# Patient Record
Sex: Female | Born: 1945 | Race: Black or African American | Hispanic: No | Marital: Married | Smoking: Never smoker
Health system: Southern US, Community
[De-identification: ages and names within clinical notes are randomized; demographics above are authoritative.]

## PROBLEM LIST (undated history)

## (undated) DIAGNOSIS — E785 Hyperlipidemia, unspecified: Secondary | ICD-10-CM

## (undated) DIAGNOSIS — I10 Essential (primary) hypertension: Secondary | ICD-10-CM

## (undated) HISTORY — PX: OTHER SURGICAL HISTORY: SHX169

---

## 2014-08-02 ENCOUNTER — Ambulatory Visit: Payer: Self-pay | Admitting: Family Medicine

## 2015-07-26 ENCOUNTER — Encounter: Payer: Self-pay | Admitting: Emergency Medicine

## 2015-07-26 ENCOUNTER — Emergency Department
Admission: EM | Admit: 2015-07-26 | Discharge: 2015-07-26 | Disposition: A | Discharge status: Home / Self Care | Payer: Medicare Other | Attending: Emergency Medicine | Admitting: Emergency Medicine

## 2015-07-26 DIAGNOSIS — K2100 Gastro-esophageal reflux disease with esophagitis, without bleeding: Secondary | ICD-10-CM

## 2015-07-26 DIAGNOSIS — R110 Nausea: Secondary | ICD-10-CM | POA: Diagnosis present

## 2015-07-26 LAB — LIPASE, BLOOD: Lipase: 29 U/L (ref 22–51)

## 2015-07-26 LAB — COMPREHENSIVE METABOLIC PANEL
ALBUMIN: 3.6 g/dL (ref 3.5–5.0)
ALK PHOS: 59 U/L (ref 38–126)
ALT: 26 U/L (ref 14–54)
ANION GAP: 6 (ref 5–15)
AST: 60 U/L — AB (ref 15–41)
BILIRUBIN TOTAL: 0.8 mg/dL (ref 0.3–1.2)
BUN: 8 mg/dL (ref 6–20)
CALCIUM: 8.5 mg/dL — AB (ref 8.9–10.3)
CO2: 27 mmol/L (ref 22–32)
Chloride: 106 mmol/L (ref 101–111)
Creatinine, Ser: 0.82 mg/dL (ref 0.44–1.00)
GFR calc Af Amer: >60 mL/min (ref >60)
GFR calc non Af Amer: >60 mL/min (ref >60)
GLUCOSE: 145 mg/dL — AB (ref 65–99)
POTASSIUM: 4.3 mmol/L (ref 3.5–5.1)
SODIUM: 139 mmol/L (ref 135–145)
TOTAL PROTEIN: 6.6 g/dL (ref 6.5–8.1)

## 2015-07-26 LAB — CBC
HEMATOCRIT: 32.4 % — AB (ref 35.0–47.0)
HEMOGLOBIN: 10.5 g/dL — AB (ref 12.0–16.0)
MCH: 31.6 pg (ref 26.0–34.0)
MCHC: 32.4 g/dL (ref 32.0–36.0)
MCV: 97.6 fL (ref 80.0–100.0)
Platelets: 136 10*3/uL — ABNORMAL LOW (ref 150–440)
RBC: 3.31 MIL/uL — ABNORMAL LOW (ref 3.80–5.20)
RDW: 14.3 % (ref 11.5–14.5)
WBC: 5 10*3/uL (ref 3.6–11.0)

## 2015-07-26 MED ORDER — ONDANSETRON 4 MG PO TBDP
4.0000 mg | ORAL_TABLET | Freq: Once | ORAL | Status: AC
  Administered 2015-07-26: 4 mg via ORAL
  Filled 2015-07-26: qty 1

## 2015-07-26 MED ORDER — ONDANSETRON HCL 4 MG PO TABS: 4.0000 mg | ORAL_TABLET | Freq: Every day | ORAL | Status: DC | PRN

## 2015-07-26 MED ORDER — SUCRALFATE 1 G PO TABS: 1.0000 g | ORAL_TABLET | Freq: Four times a day (QID) | ORAL | Status: DC

## 2015-07-26 MED ORDER — FAMOTIDINE 20 MG PO TABS
20.0000 mg | ORAL_TABLET | Freq: Once | ORAL | Status: AC
  Administered 2015-07-26: 20 mg via ORAL
  Filled 2015-07-26: qty 1

## 2015-07-26 MED ORDER — GI COCKTAIL ~~LOC~~
30.0000 mL | Freq: Once | ORAL | Status: AC
  Administered 2015-07-26: 30 mL via ORAL

## 2015-07-26 MED ORDER — GI COCKTAIL ~~LOC~~
ORAL | Status: AC
  Filled 2015-07-26: qty 30

## 2015-07-26 MED ORDER — RANITIDINE HCL 150 MG PO TABS: 150.0000 mg | ORAL_TABLET | Freq: Every day | ORAL | Status: DC

## 2015-07-26 NOTE — ED Notes (Signed)
Pt denies any abd pain and or chest pain in triage.

## 2015-07-26 NOTE — ED Provider Notes (Signed)
[facility] Emergency Department Provider Note     Time seen: ----------------------------------------- 2:47 PM on 07/26/2015 -----------------------------------------    I have reviewed the triage vital signs and the nursing notes.   HISTORY  Chief Complaint Nausea    HPI Sheri Butler is a 69 y.o. female who presents ER stating that for last 4-5 months food does not taste right and sometimes she gags on it. States she can drink and eat most things, but states they don't taste right she vomits them up. Patient states used to drink heavily, does drink anymore. She is concerned she may have affected her esophagus. She denies any fevers, chills, chest pain, shortness of breath, nausea or diarrhea.Patient denies any trouble swallowing.   History reviewed. No pertinent past medical history.  There are no active problems to display for this patient.   History reviewed. No pertinent past surgical history.  Allergies Review of patient's allergies indicates no known allergies.  Social History Social History  Substance Use Topics  . Smoking status: Never Smoker   . Smokeless tobacco: None  . Alcohol Use: Yes     Comment: everyday    Review of Systems Constitutional: Negative for fever. Eyes: Negative for visual changes. ENT: Negative for sore throat. Cardiovascular: Negative for chest pain. Respiratory: Negative for shortness of breath. Gastrointestinal: Negative for abdominal pain, positive for occasional vomiting due to the taste of food Genitourinary: Negative for dysuria. Musculoskeletal: Negative for back pain. Skin: Negative for rash. Neurological: Negative for headaches, focal weakness or numbness.  10-point ROS otherwise negative.  ____________________________________________   PHYSICAL EXAM:  VITAL SIGNS: ED Triage Vitals  Enc Vitals Group     BP 07/26/15 1136 136/78 mmHg     Pulse Rate 07/26/15 1136 85     Resp 07/26/15  1136 18     Temp 07/26/15 1136 97.9 F (36.6 C)     Temp Source 07/26/15 1136 Oral     SpO2 07/26/15 1136 100 %     Weight 07/26/15 1136 179 lb (81.194 kg)     Height 07/26/15 1136 5' (1.524 m)     Head Cir --      Peak Flow --      Pain Score --      Pain Loc --      Pain Edu? --      Excl. in [place]? --     Constitutional: Alert and oriented. Well appearing and in no distress. Eyes: Conjunctivae are normal. PERRL. Normal extraocular movements. ENT   Head: Normocephalic and atraumatic.   Nose: No congestion/rhinnorhea.   Mouth/Throat: Mucous membranes are moist.   Neck: No stridor. Cardiovascular: Normal rate, regular rhythm. Normal and symmetric distal pulses are present in all extremities. No murmurs, rubs, or gallops. Respiratory: Normal respiratory effort without tachypnea nor retractions. Breath sounds are clear and equal bilaterally. No wheezes/rales/rhonchi. Gastrointestinal: Soft and nontender. No distention. No abdominal bruits.  Musculoskeletal: Nontender with normal range of motion in all extremities. No joint effusions.  No lower extremity tenderness nor edema. Neurologic:  Normal speech and language. No gross focal neurologic deficits are appreciated. Speech is normal. No gait instability. Skin:  Skin is warm, dry and intact. No rash noted. Psychiatric: Mood and affect are normal. Speech and behavior are normal. Patient exhibits appropriate insight and judgment.  ____________________________________________  ED COURSE:  Pertinent labs & imaging results that were available during my care of the patient were reviewed by me and considered in my medical decision  making (see chart for details). Patient is in no acute distress, multiple possible etiologies. ____________________________________________    LABS (pertinent positives/negatives)  Labs Reviewed  COMPREHENSIVE METABOLIC PANEL - Abnormal; Notable for the following:    Glucose, Bld 145 (*)     Calcium 8.5 (*)    AST 60 (*)    All other components within normal limits  CBC - Abnormal; Notable for the following:    RBC 3.31 (*)    Hemoglobin 10.5 (*)    HCT 32.4 (*)    Platelets 136 (*)    All other components within normal limits  LIPASE, BLOOD    ____________________________________________  FINAL ASSESSMENT AND PLAN  Gastroesophageal reflux disease  Plan: Patient with labs and imaging as dictated above. Patient likely with GERD. We'll start on Zantac and Carafate. She'll be referred to gastroenterology for follow-up. If it is not GERD she will certainly need endoscopy, however not emergently due to this taking place for the last 6 months.   Earleen Newport, MD   Earleen Newport, MD 07/26/15 [phone]

## 2015-07-26 NOTE — ED Notes (Signed)
MD at bedside., Dr.Williams

## 2015-07-26 NOTE — ED Notes (Signed)
Pt complaining of not being able to taste food, for 3-4 months , gag on her food, tolerates fluids , speaking in completing sentences , denies difficulty swallowing

## 2015-07-26 NOTE — ED Notes (Signed)
Pt states has been nauseated and cannot eat for a couple of months. Has seen pcp for same and was given antiobiotics, but pcp is currently out of town at this time.

## 2015-07-26 NOTE — Discharge Instructions (Signed)
Gastroesophageal Reflux Disease, Adult Gastroesophageal reflux disease (GERD) happens when acid from your stomach flows up into the esophagus. When acid comes in contact with the esophagus, the acid causes soreness (inflammation) in the esophagus. Over time, GERD may create small holes (ulcers) in the lining of the esophagus. CAUSES   Increased body weight. This puts pressure on the stomach, making acid rise from the stomach into the esophagus.  Smoking. This increases acid production in the stomach.  Drinking alcohol. This causes decreased pressure in the lower esophageal sphincter (valve or ring of muscle between the esophagus and stomach), allowing acid from the stomach into the esophagus.  Late evening meals and a full stomach. This increases pressure and acid production in the stomach.  A malformed lower esophageal sphincter. Sometimes, no cause is found. SYMPTOMS   Burning pain in the lower part of the mid-chest behind the breastbone and in the mid-stomach area. This may occur twice a week or more often.  Trouble swallowing.  Sore throat.  Dry cough.  Asthma-like symptoms including chest tightness, shortness of breath, or wheezing. DIAGNOSIS  Your caregiver may be able to diagnose GERD based on your symptoms. In some cases, X-rays and other tests may be done to check for complications or to check the condition of your stomach and esophagus. TREATMENT  Your caregiver may recommend over-the-counter or prescription medicines to help decrease acid production. Ask your caregiver before starting or adding any new medicines.  HOME CARE INSTRUCTIONS   Change the factors that you can control. Ask your caregiver for guidance concerning weight loss, quitting smoking, and alcohol consumption.  Avoid foods and drinks that make your symptoms worse, such as:  Caffeine or alcoholic drinks.  Chocolate.  Peppermint or mint flavorings.  Garlic and onions.  Spicy foods.  Citrus fruits,  such as oranges, lemons, or limes.  Tomato-based foods such as sauce, chili, salsa, and pizza.  Fried and fatty foods.  Avoid lying down for the 3 hours prior to your bedtime or prior to taking a nap.  Eat small, frequent meals instead of large meals.  Wear loose-fitting clothing. Do not wear anything tight around your waist that causes pressure on your stomach.  Raise the head of your bed 6 to 8 inches with wood blocks to help you sleep. Extra pillows will not help.  Only take over-the-counter or prescription medicines for pain, discomfort, or fever as directed by your caregiver.  Do not take aspirin, ibuprofen, or other nonsteroidal anti-inflammatory drugs (NSAIDs). SEEK IMMEDIATE MEDICAL CARE IF:   You have pain in your arms, neck, jaw, teeth, or back.  Your pain increases or changes in intensity or duration.  You develop nausea, vomiting, or sweating (diaphoresis).  You develop shortness of breath, or you faint.  Your vomit is green, yellow, black, or looks like coffee grounds or blood.  Your stool is red, bloody, or black. These symptoms could be signs of other problems, such as heart disease, gastric bleeding, or esophageal bleeding. MAKE SURE YOU:   Understand these instructions.  Will watch your condition.  Will get help right away if you are not doing well or get worse. Document Released: 09/03/2005 Document Revised: 02/16/2012 Document Reviewed: 06/13/2011 Exit[employer] Patient Information 2015 [employer]. This information is not intended to replace advice given to you by your health care provider. Make sure you discuss any questions you have with your health care provider.  

## 2015-10-29 ENCOUNTER — Other Ambulatory Visit: Payer: Self-pay | Admitting: Family Medicine

## 2015-10-29 DIAGNOSIS — R432 Parageusia: Secondary | ICD-10-CM

## 2015-10-31 ENCOUNTER — Other Ambulatory Visit: Payer: Self-pay | Admitting: Family Medicine

## 2015-10-31 DIAGNOSIS — R634 Abnormal weight loss: Secondary | ICD-10-CM

## 2015-10-31 DIAGNOSIS — R7989 Other specified abnormal findings of blood chemistry: Secondary | ICD-10-CM

## 2015-10-31 DIAGNOSIS — R945 Abnormal results of liver function studies: Secondary | ICD-10-CM

## 2015-11-08 ENCOUNTER — Encounter: Payer: Self-pay | Admitting: Emergency Medicine

## 2015-11-08 ENCOUNTER — Ambulatory Visit
Admission: RE | Admit: 2015-11-08 | Discharge: 2015-11-08 | Disposition: A | Discharge status: Home / Self Care | Payer: Medicare Other | Source: Ambulatory Visit | Attending: Family Medicine | Admitting: Family Medicine

## 2015-11-08 ENCOUNTER — Inpatient Hospital Stay
Admission: EM | Admit: 2015-11-08 | Discharge: 2015-11-12 | DRG: 682 | Disposition: A | Discharge status: Home / Self Care | Payer: Medicare Other | Attending: Internal Medicine | Admitting: Internal Medicine

## 2015-11-08 DIAGNOSIS — R809 Proteinuria, unspecified: Secondary | ICD-10-CM | POA: Diagnosis present

## 2015-11-08 DIAGNOSIS — R630 Anorexia: Secondary | ICD-10-CM | POA: Diagnosis present

## 2015-11-08 DIAGNOSIS — Z6831 Body mass index (BMI) 31.0-31.9, adult: Secondary | ICD-10-CM | POA: Diagnosis not present

## 2015-11-08 DIAGNOSIS — R634 Abnormal weight loss: Secondary | ICD-10-CM | POA: Insufficient documentation

## 2015-11-08 DIAGNOSIS — R1110 Vomiting, unspecified: Secondary | ICD-10-CM | POA: Diagnosis present

## 2015-11-08 DIAGNOSIS — N179 Acute kidney failure, unspecified: Secondary | ICD-10-CM | POA: Diagnosis present

## 2015-11-08 DIAGNOSIS — D649 Anemia, unspecified: Secondary | ICD-10-CM | POA: Diagnosis present

## 2015-11-08 DIAGNOSIS — R945 Abnormal results of liver function studies: Secondary | ICD-10-CM

## 2015-11-08 DIAGNOSIS — R748 Abnormal levels of other serum enzymes: Secondary | ICD-10-CM

## 2015-11-08 DIAGNOSIS — E785 Hyperlipidemia, unspecified: Secondary | ICD-10-CM | POA: Diagnosis present

## 2015-11-08 DIAGNOSIS — K219 Gastro-esophageal reflux disease without esophagitis: Secondary | ICD-10-CM | POA: Diagnosis present

## 2015-11-08 DIAGNOSIS — E43 Unspecified severe protein-calorie malnutrition: Secondary | ICD-10-CM | POA: Diagnosis present

## 2015-11-08 DIAGNOSIS — Z79899 Other long term (current) drug therapy: Secondary | ICD-10-CM | POA: Diagnosis not present

## 2015-11-08 DIAGNOSIS — N170 Acute kidney failure with tubular necrosis: Principal | ICD-10-CM | POA: Diagnosis present

## 2015-11-08 DIAGNOSIS — K8020 Calculus of gallbladder without cholecystitis without obstruction: Secondary | ICD-10-CM

## 2015-11-08 DIAGNOSIS — I10 Essential (primary) hypertension: Secondary | ICD-10-CM | POA: Diagnosis present

## 2015-11-08 DIAGNOSIS — E860 Dehydration: Secondary | ICD-10-CM | POA: Diagnosis present

## 2015-11-08 DIAGNOSIS — Z7982 Long term (current) use of aspirin: Secondary | ICD-10-CM | POA: Diagnosis not present

## 2015-11-08 DIAGNOSIS — I959 Hypotension, unspecified: Secondary | ICD-10-CM | POA: Diagnosis present

## 2015-11-08 DIAGNOSIS — R7989 Other specified abnormal findings of blood chemistry: Secondary | ICD-10-CM

## 2015-11-08 HISTORY — DX: Essential (primary) hypertension: I10

## 2015-11-08 LAB — URINALYSIS COMPLETE WITH MICROSCOPIC (ARMC ONLY)
Bilirubin Urine: NEGATIVE
Glucose, UA: NEGATIVE mg/dL
KETONES UR: NEGATIVE mg/dL
NITRITE: NEGATIVE
PH: 6 (ref 5.0–8.0)
Protein, ur: 30 mg/dL — AB
SPECIFIC GRAVITY, URINE: 1.006 (ref 1.005–1.030)

## 2015-11-08 LAB — SODIUM, URINE, RANDOM: SODIUM UR: 11 mmol/L

## 2015-11-08 LAB — COMPREHENSIVE METABOLIC PANEL
ALBUMIN: 3.1 g/dL — AB (ref 3.5–5.0)
ALT: 50 U/L (ref 14–54)
AST: 79 U/L — AB (ref 15–41)
Alkaline Phosphatase: 91 U/L (ref 38–126)
Anion gap: 7 (ref 5–15)
BILIRUBIN TOTAL: 0.3 mg/dL (ref 0.3–1.2)
BUN: 21 mg/dL — ABNORMAL HIGH (ref 6–20)
CO2: 20 mmol/L — AB (ref 22–32)
Calcium: 8.7 mg/dL — ABNORMAL LOW (ref 8.9–10.3)
Chloride: 111 mmol/L (ref 101–111)
Creatinine, Ser: 3.81 mg/dL — ABNORMAL HIGH (ref 0.44–1.00)
GFR calc Af Amer: 13 mL/min — ABNORMAL LOW (ref >60)
GFR calc non Af Amer: 11 mL/min — ABNORMAL LOW (ref >60)
GLUCOSE: 109 mg/dL — AB (ref 65–99)
POTASSIUM: 4.1 mmol/L (ref 3.5–5.1)
SODIUM: 138 mmol/L (ref 135–145)
Total Protein: 6.5 g/dL (ref 6.5–8.1)

## 2015-11-08 LAB — CBC
HCT: 33.3 % — ABNORMAL LOW (ref 35.0–47.0)
HEMOGLOBIN: 10.7 g/dL — AB (ref 12.0–16.0)
MCH: 34.2 pg — AB (ref 26.0–34.0)
MCHC: 32.1 g/dL (ref 32.0–36.0)
MCV: 106.6 fL — ABNORMAL HIGH (ref 80.0–100.0)
PLATELETS: 140 10*3/uL — AB (ref 150–440)
RBC: 3.13 MIL/uL — AB (ref 3.80–5.20)
RDW: 15.8 % — ABNORMAL HIGH (ref 11.5–14.5)
WBC: 3.8 10*3/uL (ref 3.6–11.0)

## 2015-11-08 LAB — CREATININE, URINE, RANDOM: Creatinine, Urine: 84 mg/dL

## 2015-11-08 MED ORDER — ASPIRIN EC 81 MG PO TBEC
81.0000 mg | DELAYED_RELEASE_TABLET | Freq: Every day | ORAL | Status: DC
  Filled 2015-11-08: qty 1

## 2015-11-08 MED ORDER — ATENOLOL 25 MG PO TABS
25.0000 mg | ORAL_TABLET | Freq: Every day | ORAL | Status: DC
  Filled 2015-11-08: qty 1

## 2015-11-08 MED ORDER — ACETAMINOPHEN 325 MG PO TABS: 650.0000 mg | ORAL_TABLET | Freq: Four times a day (QID) | ORAL | Status: DC | PRN

## 2015-11-08 MED ORDER — ONDANSETRON HCL 4 MG PO TABS: 4.0000 mg | ORAL_TABLET | Freq: Four times a day (QID) | ORAL | Status: DC | PRN

## 2015-11-08 MED ORDER — SODIUM CHLORIDE 0.9 % IV SOLN
Freq: Once | INTRAVENOUS | Status: AC
  Administered 2015-11-08: 19:00:00 via INTRAVENOUS

## 2015-11-08 MED ORDER — HEPARIN SODIUM (PORCINE) 5000 UNIT/ML IJ SOLN
5000.0000 [IU] | Freq: Three times a day (TID) | INTRAMUSCULAR | Status: DC
  Administered 2015-11-09 – 2015-11-10 (×4): 5000 [IU] via SUBCUTANEOUS
  Filled 2015-11-08 (×4): qty 1

## 2015-11-08 MED ORDER — ONDANSETRON HCL 4 MG/2ML IJ SOLN
4.0000 mg | Freq: Four times a day (QID) | INTRAMUSCULAR | Status: DC | PRN
Start: 2015-11-08 — End: 2015-11-12

## 2015-11-08 MED ORDER — MORPHINE SULFATE (PF) 2 MG/ML IV SOLN: 2.0000 mg | INTRAVENOUS | Status: DC | PRN

## 2015-11-08 MED ORDER — SODIUM CHLORIDE 0.9 % IV SOLN
INTRAVENOUS | Status: DC
  Administered 2015-11-09 – 2015-11-11 (×7): via INTRAVENOUS

## 2015-11-08 MED ORDER — OXYCODONE HCL 5 MG PO TABS: 5.0000 mg | ORAL_TABLET | ORAL | Status: DC | PRN

## 2015-11-08 MED ORDER — POTASSIUM CHLORIDE CRYS ER 20 MEQ PO TBCR
20.0000 meq | EXTENDED_RELEASE_TABLET | Freq: Every day | ORAL | Status: DC
  Administered 2015-11-09 – 2015-11-12 (×4): 20 meq via ORAL
  Filled 2015-11-08 (×4): qty 1

## 2015-11-08 MED ORDER — ACETAMINOPHEN 650 MG RE SUPP: 650.0000 mg | Freq: Four times a day (QID) | RECTAL | Status: DC | PRN

## 2015-11-08 MED ORDER — PRAVASTATIN SODIUM 40 MG PO TABS
80.0000 mg | ORAL_TABLET | Freq: Every day | ORAL | Status: DC
  Administered 2015-11-10 – 2015-11-12 (×3): 80 mg via ORAL
  Filled 2015-11-08 (×4): qty 2

## 2015-11-08 MED ORDER — FLUTICASONE PROPIONATE 50 MCG/ACT NA SUSP
2.0000 | Freq: Every day | NASAL | Status: DC
  Filled 2015-11-08: qty 16

## 2015-11-08 NOTE — ED Notes (Signed)
Lab called [facility] hemolyzed, canx'd and redraw

## 2015-11-08 NOTE — ED Notes (Signed)
Pt daughter updated with pt room number assignment per pt

## 2015-11-08 NOTE — ED Notes (Signed)
Pt given phone for family member call, daughter, Larene Beach [phone]

## 2015-11-08 NOTE — ED Notes (Signed)
Patient presents to the ED after receiving a call from staff at [facility] telling patient that it was very important that she came in to the Emergency Department because she was very dehydrated according to her lab results.  Patient had routine labs drawn several days before.  Patient denies any nausea, vomiting, or diarrhea.  Patient states she has felt fine lately.

## 2015-11-08 NOTE — ED Notes (Signed)
Pt states that she was sent here from [facility] due to "dehydration". Denies pain. Pt alert and oriented X4, active, cooperative, pt in NAD. RR even and unlabored, color WNL.

## 2015-11-08 NOTE — ED Notes (Signed)
Lab notified pt blood specimen sent for testing. Lab verbally acknowledged.

## 2015-11-08 NOTE — ED Provider Notes (Signed)
[facility] Emergency Department Provider Note  ____________________________________________  Time seen: Approximately 8:17 PM  I have reviewed the triage vital signs and the nursing notes.   HISTORY  Chief Complaint Dehydration    HPI Sheri Butler is a 69 y.o. female who was called from [facility] and told to come to the ER because her labs look showed severe dehydration. Labs were drawn on Tuesday she reports. Patient says she feels well has not had any nausea vomiting black tarry stool or anything else. Denies any medical problems.  History reviewed. No pertinent past medical history.  There are no active problems to display for this patient.   History reviewed. No pertinent past surgical history.  Current Outpatient Rx  Name  Route  Sig  Dispense  Refill  . aspirin EC 81 MG tablet   Oral   Take 81 mg by mouth daily.         Marland Kitchen atenolol (TENORMIN) 25 MG tablet   Oral   Take 25 mg by mouth daily.          . fluticasone (FLONASE) 50 MCG/ACT nasal spray   Each Nare   Place 2 sprays into both nostrils daily.         Marland Kitchen ibuprofen (ADVIL,MOTRIN) 800 MG tablet   Oral   Take 1 tablet by mouth every 8 (eight) hours as needed.         . potassium chloride SA (K-DUR,KLOR-CON) 20 MEQ tablet   Oral   Take 1 tablet by mouth daily.         . pravastatin (PRAVACHOL) 80 MG tablet   Oral   Take 1 tablet by mouth daily.         . quinapril (ACCUPRIL) 20 MG tablet   Oral   Take 1 tablet by mouth daily.         . ondansetron (ZOFRAN) 4 MG tablet   Oral   Take 1 tablet (4 mg total) by mouth daily as needed for nausea or vomiting. Patient not taking: Reported on 11/08/2015   20 tablet   1   . ranitidine (ZANTAC) 150 MG tablet   Oral   Take 1 tablet (150 mg total) by mouth at bedtime. Patient not taking: Reported on 11/08/2015   30 tablet   1   . sucralfate (CARAFATE) 1 G tablet   Oral   Take 1 tablet (1 g total) by mouth 4  (four) times daily. Patient not taking: Reported on 11/08/2015   120 tablet   1     Allergies Review of patient's allergies indicates no known allergies.  No family history on file.  Social History Social History  Substance Use Topics  . Smoking status: Never Smoker   . Smokeless tobacco: None  . Alcohol Use: Yes     Comment: everyday    Review of Systems Constitutional: No fever/chills Eyes: No visual changes. ENT: No sore throat. Cardiovascular: Denies chest pain. Respiratory: Denies shortness of breath. Gastrointestinal: No abdominal pain.  No nausea, no vomiting.  No diarrhea.  No constipation. Genitourinary: Negative for dysuria. Musculoskeletal: Negative for back pain. Skin: Negative for rash. Neurological: Negative for headaches, focal weakness or numbness.  10-point ROS otherwise negative.  ____________________________________________   PHYSICAL EXAM:  VITAL SIGNS: ED Triage Vitals  Enc Vitals Group     BP 11/08/15 1751 115/73 mmHg     Pulse Rate 11/08/15 1751 68     Resp 11/08/15 1751 18  Temp 11/08/15 1751 97.3 F (36.3 C)     Temp Source 11/08/15 1751 Oral     SpO2 11/08/15 1751 100 %     Weight 11/08/15 1751 164 lb (74.39 kg)     Height 11/08/15 1751 5' (1.524 m)     Head Cir --      Peak Flow --      Pain Score 11/08/15 1757 0     Pain Loc --      Pain Edu? --      Excl. in [place]? --     Constitutional: Alert and oriented. Well appearing and in no acute distress. Eyes: Conjunctivae are normal. PERRL. EOMI. Head: Atraumatic. Nose: No congestion/rhinnorhea. Mouth/Throat: Mucous membranes are moist.  Oropharynx non-erythematous. Neck: No stridor. Cardiovascular: Normal rate, regular rhythm. Grossly normal heart sounds.  Good peripheral circulation. Respiratory: Normal respiratory effort.  No retractions. Lungs CTAB. Gastrointestinal: Soft and nontender. No distention. No abdominal bruits. No CVA tenderness. Musculoskeletal: No lower  extremity tenderness nor edema.  No joint effusions. Neurologic:  Normal speech and language. No gross focal neurologic deficits are appreciated. No gait instability. Skin:  Skin is warm, dry and intact. No rash noted. Psychiatric: Mood and affect are normal. Speech and behavior are normal.  ____________________________________________   LABS (all labs ordered are listed, but only abnormal results are displayed)  Labs Reviewed  CBC - Abnormal; Notable for the following:    RBC 3.13 (*)    Hemoglobin 10.7 (*)    HCT 33.3 (*)    MCV 106.6 (*)    MCH 34.2 (*)    RDW 15.8 (*)    Platelets 140 (*)    All other components within normal limits  COMPREHENSIVE METABOLIC PANEL - Abnormal; Notable for the following:    CO2 20 (*)    Glucose, Bld 109 (*)    BUN 21 (*)    Creatinine, Ser 3.81 (*)    Calcium 8.7 (*)    Albumin 3.1 (*)    AST 79 (*)    GFR calc non Af Amer 11 (*)    GFR calc Af Amer 13 (*)    All other components within normal limits  URINALYSIS COMPLETEWITH MICROSCOPIC ([facility] ONLY)   ____________________________________________  EKG  _______________________________________  RADIOLOGY   ____________________________________________   PROCEDURES   ____________________________________________   INITIAL IMPRESSION / ASSESSMENT AND PLAN / ED COURSE  Pertinent labs & imaging results that were available during my care of the patient were reviewed by me and considered in my medical decision making (see chart for details).   ____________________________________________   FINAL CLINICAL IMPRESSION(S) / ED DIAGNOSES  Final diagnoses:  Acute renal failure, unspecified acute renal failure type [facility])      Nena Polio, MD 11/08/15 2229

## 2015-11-08 NOTE — ED Notes (Signed)
Chrys Racer called for pt arrival

## 2015-11-08 NOTE — ED Notes (Signed)
Call from lab, blood hemolyzed, sending lab for draw

## 2015-11-08 NOTE — H&P (Signed)
[facility] [place] NAME: Sheri Butler    MR#:  [id number]  DATE OF BIRTH:  04-27-1946   DATE OF ADMISSION:  11/08/2015  PRIMARY CARE PHYSICIAN: Baltazar Apo, MD   REQUESTING/REFERRING PHYSICIAN: Malinda  CHIEF COMPLAINT:   Chief Complaint  Patient presents with  . Dehydration   abnormal labs  HISTORY OF PRESENT ILLNESS:  Sheri Butler  is a 69 y.o. female with a known history of essential hypertension who is presenting to the hospital on the insistence of her PCP saying she had markedly abnormal blood work. She had blood work performed because she has had ongoing approximately 7 months duration of food intolerance. She states that certain types of foods she can no longer handle which causes her to vomit therefore she is had poor oral intake for that entire duration. She states liquids do not bother her in factseafood and sugary foods also do not cause symptoms. Regardless of the workup of these above findings she had basic lab work performed which revealed markedly kidney injury. She denies any lightheadedness, fever, chills, recent infections, change in urination. She does take the occasional NSAID however rarely  PAST MEDICAL HISTORY:   Past Medical History  Diagnosis Date  . Hypertension     PAST SURGICAL HISTORY:  History reviewed. No pertinent past surgical history.  SOCIAL HISTORY:   Social History  Substance Use Topics  . Smoking status: Never Smoker   . Smokeless tobacco: Not on file  . Alcohol Use: Yes     Comment: everyday    FAMILY HISTORY:   Family History  Problem Relation Age of Onset  . Hypertension Other     DRUG ALLERGIES:  No Known Allergies  REVIEW OF SYSTEMS:  REVIEW OF SYSTEMS:  CONSTITUTIONAL: Denies fevers, chills, fatigue, weakness.  EYES: Denies blurred vision, double vision, or eye pain.  EARS, NOSE, THROAT: Denies tinnitus, ear pain, hearing loss.  RESPIRATORY: denies cough,  shortness of breath, wheezing  CARDIOVASCULAR: Denies chest pain, palpitations, edema.  GASTROINTESTINAL: Denies nausea,  positive vomiting,  denies diarrhea, abdominal pain.  GENITOURINARY: Denies dysuria, hematuria.  ENDOCRINE: Denies nocturia or thyroid problems. HEMATOLOGIC AND LYMPHATIC: Denies easy bruising or bleeding.  SKIN: Denies rash or lesions.  MUSCULOSKELETAL: Denies pain in neck, back, shoulder, knees, hips, or further arthritic symptoms.  NEUROLOGIC: Denies paralysis, paresthesias.  PSYCHIATRIC: Denies anxiety or depressive symptoms. Otherwise full review of systems performed by me is negative.   MEDICATIONS AT HOME:   Prior to Admission medications   Medication Sig Start Date End Date Taking? Authorizing Provider  aspirin EC 81 MG tablet Take 81 mg by mouth daily.   Yes Historical Provider, MD  atenolol (TENORMIN) 25 MG tablet Take 25 mg by mouth daily.  10/19/15  Yes Historical Provider, MD  fluticasone (FLONASE) 50 MCG/ACT nasal spray Place 2 sprays into both nostrils daily. 10/19/15  Yes Historical Provider, MD  ibuprofen (ADVIL,MOTRIN) 800 MG tablet Take 1 tablet by mouth every 8 (eight) hours as needed. 09/20/15  Yes Historical Provider, MD  potassium chloride SA (K-DUR,KLOR-CON) 20 MEQ tablet Take 1 tablet by mouth daily. 08/30/15  Yes Historical Provider, MD  pravastatin (PRAVACHOL) 80 MG tablet Take 1 tablet by mouth daily. 10/19/15  Yes Historical Provider, MD  quinapril (ACCUPRIL) 20 MG tablet Take 1 tablet by mouth daily. 10/19/15  Yes Historical Provider, MD  ondansetron (ZOFRAN) 4 MG tablet Take 1 tablet (4 mg total) by mouth daily as needed  for nausea or vomiting. Patient not taking: Reported on 11/08/2015 07/26/15   Earleen Newport, MD  ranitidine (ZANTAC) 150 MG tablet Take 1 tablet (150 mg total) by mouth at bedtime. Patient not taking: Reported on 11/08/2015 07/26/15 07/25/16  Earleen Newport, MD  sucralfate (CARAFATE) 1 G tablet Take 1 tablet (1 g  total) by mouth 4 (four) times daily. Patient not taking: Reported on 11/08/2015 07/26/15 07/25/16  Earleen Newport, MD      VITAL SIGNS:  Blood pressure 101/53, pulse 58, temperature 97.3 F (36.3 C), temperature source Oral, resp. rate 18, height 5' (1.524 m), weight 164 lb (74.39 kg), SpO2 100 %.  PHYSICAL EXAMINATION:  VITAL SIGNS: Filed Vitals:   11/08/15 2049 11/08/15 2226  BP: 97/58 101/53  Pulse: 57 58  Temp:    Resp: 18 18   GENERAL:69 y.o.female currently in no acute distress.  HEAD: Normocephalic, atraumatic.  EYES: Pupils equal, round, reactive to light. Extraocular muscles intact. No scleral icterus.  MOUTH: Moist mucosal membrane. Dentition intact. No abscess noted.  EAR, NOSE, THROAT: Clear without exudates. No external lesions.  NECK: Supple. No thyromegaly. No nodules. No JVD.  PULMONARY: Clear to ascultation, without wheeze rails or rhonci. No use of accessory muscles, Good respiratory effort. good air entry bilaterally CHEST: Nontender to palpation.  CARDIOVASCULAR: S1 and S2. Regular rate and rhythm. No murmurs, rubs, or gallops. No edema. Pedal pulses 2+ bilaterally.  GASTROINTESTINAL: Soft, nontender, nondistended. No masses. Positive bowel sounds. No hepatosplenomegaly.  MUSCULOSKELETAL: No swelling, clubbing, or edema. Range of motion full in all extremities.  NEUROLOGIC: Cranial nerves II through XII are intact. No gross focal neurological deficits. Sensation intact. Reflexes intact.  SKIN: No ulceration, lesions, rashes, or cyanosis. Skin warm and dry. Turgor intact.  PSYCHIATRIC: Mood, affect within normal limits. The patient is awake, alert and oriented x 3. Insight, judgment intact.    LABORATORY PANEL:   CBC  Recent Labs Lab 11/08/15 1808  WBC 3.8  HGB 10.7*  HCT 33.3*  PLT 140*   ------------------------------------------------------------------------------------------------------------------  Chemistries   Recent Labs Lab  11/08/15 2022  NA 138  K 4.1  CL 111  CO2 20*  GLUCOSE 109*  BUN 21*  CREATININE 3.81*  CALCIUM 8.7*  AST 79*  ALT 50  ALKPHOS 91  BILITOT 0.3   ------------------------------------------------------------------------------------------------------------------  Cardiac Enzymes No results for input(s): TROPONINI in the last 168 hours. ------------------------------------------------------------------------------------------------------------------  RADIOLOGY:  US Abdomen Complete  11/08/2015  CLINICAL DATA:  Abnormal weight loss and elevated liver enzymes EXAM: ULTRASOUND ABDOMEN COMPLETE COMPARISON:  None. FINDINGS: Gallbladder: Within the gallbladder, there are multiple echogenic foci which move and shadow consistent with gallstones. Largest individual gallstone measures 5 mm. There is mild gallbladder wall thickening at several sites within the gallbladder. The gallbladder wall does not appear edematous, and there is no pericholecystic fluid. No sonographic Murphy sign noted. Common bile duct: Diameter: 4 mm. There is no intrahepatic, common hepatic, or common bile duct dilatation. Liver: No focal lesion identified. Liver echogenicity is diffusely increased. IVC: No abnormality visualized. Pancreas: Visualized portion unremarkable. Much of the pancreas is obscured by gas. Spleen: Size and appearance within normal limits. Right Kidney: Length: 9.9 cm. Echogenicity within normal limits. No mass or hydronephrosis visualized. Left Kidney: Length: 9.6 cm. Echogenicity within normal limits. No mass or hydronephrosis visualized. Abdominal aorta: No aneurysm visualized. Other findings: No demonstrable ascites. IMPRESSION: Cholelithiasis. Areas of the gallbladder wall appear mildly thickened. There may be a degree of chronic cholecystitis.  Note that there is no pericholecystic fluid or focal tenderness over the gallbladder, however. Diffuse increased liver echogenicity is most likely indicative of  hepatic steatosis. While no focal liver lesions are identified, it must be cautioned that the sensitivity of ultrasound for focal liver lesions is diminished significantly given this appearance. Much of the pancreas is obscured by gas. The visualized portions of pancreas appear within normal limits. Study otherwise unremarkable. Electronically Signed   By: Lowella Grip III M.D.   On: 11/08/2015 09:35   Dg Esophagus  11/08/2015  CLINICAL DATA:  Loss of taste. EXAM: ESOPHOGRAM/BARIUM SWALLOW TECHNIQUE: Combined double contrast and single contrast examination performed using effervescent crystals, thick barium liquid, and thin barium liquid. FLUOROSCOPY TIME:  Radiation Exposure Index (as provided by the fluoroscopic device): 57.0 mGy COMPARISON:  No prior . FINDINGS: Prominent cricopharyngeus muscle. Cervical esophagus otherwise unremarkable. Mild narrowing of the distal esophagus. This esophageal small ulcer or diverticulum cannot be excluded. Endoscopic evaluation is suggested. No mass lesion. No reflux. Standardized barium tablet passed easily into the stomach. IMPRESSION: 1. Prominent cricopharyngeus muscle. 2. Mild distal esophageal narrowing. Small distal esophageal ulceration or diverticulum cannot be excluded. Endoscopic evaluation is suggested for further evaluation. No reflux. Standardized barium tablet passed easily into the stomach. Electronically Signed   By: Marcello Moores  Register   On: 11/08/2015 10:28    EKG:  No orders found for this or any previous visit.  IMPRESSION AND PLAN:   69 year old African-American female who is presenting a troponin I of abnormal blood work  1. Acute kidney injury: Unclear etiology however likely now ATN given the long-standing history of poor oral intake secondary to vomiting and food intolerance. IV fluid challenge, following urine output/renal function. We'll check urinary sodium and creatinine to calculate fena, check a renal ultrasound, avoid further  nephrotoxic agents including holding ACE inhibitor and further NSAIDs. 2. Essential hypertension: Continue with atenolol hold ACE inhibitor 3. Hyperlipidemia unspecified: Statin therapy 4. GERD without esophagitis: Zantac 5. Venous thromboembolism prophylactic: Heparin subcutaneous   All the records are reviewed and case discussed with ED provider. Management plans discussed with the patient, family and they are in agreement.  CODE STATUS: Full  TOTAL TIME TAKING CARE OF THIS PATIENT: 35  minutes.    Jesiel Garate,  Karenann Cai.D on 11/08/2015 at 11:14 PM  Between 7am to 6pm - Pager - [phone]  After 6pm: House Pager: - [phone]  [facility]  Office  [phone]  CC: Primary care physician; Baltazar Apo, MD

## 2015-11-08 NOTE — ED Notes (Signed)
[name], Lab called to add on

## 2015-11-09 LAB — CBC
HCT: 30.7 % — ABNORMAL LOW (ref 35.0–47.0)
HEMOGLOBIN: 10 g/dL — AB (ref 12.0–16.0)
MCH: 34.7 pg — ABNORMAL HIGH (ref 26.0–34.0)
MCHC: 32.5 g/dL (ref 32.0–36.0)
MCV: 106.9 fL — ABNORMAL HIGH (ref 80.0–100.0)
Platelets: 98 10*3/uL — ABNORMAL LOW (ref 150–440)
RBC: 2.87 MIL/uL — AB (ref 3.80–5.20)
RDW: 15.3 % — ABNORMAL HIGH (ref 11.5–14.5)
WBC: 3.1 10*3/uL — AB (ref 3.6–11.0)

## 2015-11-09 LAB — BASIC METABOLIC PANEL
ANION GAP: 4 — AB (ref 5–15)
BUN: 17 mg/dL (ref 6–20)
CALCIUM: 8.5 mg/dL — AB (ref 8.9–10.3)
CO2: 21 mmol/L — ABNORMAL LOW (ref 22–32)
Chloride: 116 mmol/L — ABNORMAL HIGH (ref 101–111)
Creatinine, Ser: 3.52 mg/dL — ABNORMAL HIGH (ref 0.44–1.00)
GFR, EST AFRICAN AMERICAN: 14 mL/min — AB (ref >60)
GFR, EST NON AFRICAN AMERICAN: 12 mL/min — AB (ref >60)
GLUCOSE: 92 mg/dL (ref 65–99)
Potassium: 4.4 mmol/L (ref 3.5–5.1)
SODIUM: 141 mmol/L (ref 135–145)

## 2015-11-09 MED ORDER — ENSURE ENLIVE PO LIQD
237.0000 mL | Freq: Two times a day (BID) | ORAL | Status: DC
  Administered 2015-11-09 – 2015-11-12 (×6): 237 mL via ORAL

## 2015-11-09 NOTE — Plan of Care (Signed)
Problem: Education: Goal: Knowledge of [facility] Education information/materials will improve Outcome: Progressing Patient information folder given to patient.  Patient educated on reason for admission and the care/treatment plan.    Problem: Safety: Goal: Ability to remain free from injury will improve Outcome: Progressing Patient up independently in room.  Patient is steady on feet and plantar and dorsiflexion are strong.  Patient with chronic hypotension.  Educated on sitting on side of bed for a few moments in case of dizziness before walking.  Bed alarm on for safety.  Patient compliant with calling for assistance.  Patient without injury this shift.   Problem: Fluid Volume: Goal: Ability to maintain a balanced intake and output will improve Outcome: Progressing Patient admitted for dehydration and AKI.  IVF .9 NS running at 100 mL/hr.

## 2015-11-09 NOTE — Evaluation (Signed)
Physical Therapy Evaluation Patient Details Name: Sheri Butler MRN: [id number] DOB: 1946-11-01 Today's Date: 11/09/2015   History of Present Illness  Sheri Butler is a 69 y.o. female with a known history of essential hypertension who is presenting to the hospital on the insistence of her PCP saying she had markedly abnormal blood work. She had blood work performed because she has had ongoing approximately 7 months duration of food intolerance. She states that certain types of foods she can no longer handle which causes her to vomit therefore she is had poor oral intake for that entire duration. She states liquids do not bother her in factseafood and sugary foods also do not cause symptoms. Regardless of the workup of these above findings she had basic lab work performed which revealed markedly kidney injury. She denies any lightheadedness, fever, chills, recent infections, change in urination. She does take the occasional NSAID however rarely. Denies falls in the last 12 months  Clinical Impression  Pt is independent with all mobility at this time. She denies dizziness and reports she is at her baseline level of functioning. High level balance deficits in single leg stance, pt refuses OP PT. No further PT needs at this time. Pt is safe to return home with family at discharge. Order will be completed. Please enter new order if status changes. Pt will benefit from skilled PT services to address deficits in strength, balance, and mobility in order to return to full function at home.    Follow Up Recommendations No PT follow up (Refuse OP PT for balance)    Equipment Recommendations  None recommended by PT    Recommendations for Other Services       Precautions / Restrictions Precautions Precautions: None Restrictions Weight Bearing Restrictions: No      Mobility  Bed Mobility Overal bed mobility: Independent             General bed mobility comments: Good speed and  sequencing  Transfers Overall transfer level: Independent Equipment used: None             General transfer comment: Good speed, sequencing, and stability without assistive device  Ambulation/Gait Ambulation/Gait assistance: Independent Ambulation Distance (Feet): 80 Feet Assistive device: None Gait Pattern/deviations: WFL(Within Functional Limits)     General Gait Details: Slightly decreased speed with wide BOS and decreased step length. Overall WFL for community ambulation  Stairs            Wheelchair Mobility    Modified Rankin (Stroke Patients Only)       Balance Overall balance assessment: Needs assistance Sitting-balance support: No upper extremity supported Sitting balance-Leahy Scale: Normal       Standing balance-Leahy Scale: Good Standing balance comment: Single leg stance LLE: 5 seconds, RLE: 2 seconds                             Pertinent Vitals/Pain Pain Assessment: No/denies pain    Home Living Family/patient expects to be discharged to:: Private residence Living Arrangements: Children Available Help at Discharge: Family Type of Home: Apartment         Home Equipment: None      Prior Function Level of Independence: Independent               Hand Dominance        Extremity/Trunk Assessment   Upper Extremity Assessment: Overall WFL for tasks assessed  Lower Extremity Assessment: Overall WFL for tasks assessed         Communication   Communication: No difficulties  Cognition Arousal/Alertness: Awake/alert Behavior During Therapy: WFL for tasks assessed/performed Overall Cognitive Status: Within Functional Limits for tasks assessed                      General Comments      Exercises        Assessment/Plan    PT Assessment Patent does not need any further PT services  PT Diagnosis     PT Problem List    PT Treatment Interventions     PT Goals (Current goals can be  found in the Care Plan section)      Frequency     Barriers to discharge        Co-evaluation               End of Session Equipment Utilized During Treatment: Gait belt Activity Tolerance: Patient tolerated treatment well Patient left: in bed;with call bell/phone within reach           Time: 1015-1023 PT Time Calculation (min) (ACUTE ONLY): 8 min   Charges:     PT Treatments $Therapeutic Exercise: 8-22 mins   PT G Codes:       Lyndel Safe Ayshia Gramlich PT, DPT   Waleska Buttery 11/09/2015, 10:29 AM

## 2015-11-09 NOTE — Progress Notes (Signed)
[facility] [place] NAME: Sheri Butler    MR#:  [id number]  DATE OF BIRTH:  08-10-1946  SUBJECTIVE:  CHIEF COMPLAINT:   Chief Complaint  Patient presents with  . Dehydration  feels ok. No complaints, feeling tired.  REVIEW OF SYSTEMS:  Review of Systems  Constitutional: Negative for fever, weight loss, malaise/fatigue and diaphoresis.  HENT: Negative for ear discharge, ear pain, hearing loss, nosebleeds, sore throat and tinnitus.   Eyes: Negative for blurred vision and pain.  Respiratory: Negative for cough, hemoptysis, shortness of breath and wheezing.   Cardiovascular: Negative for chest pain, palpitations, orthopnea and leg swelling.  Gastrointestinal: Negative for heartburn, nausea, vomiting, abdominal pain, diarrhea, constipation and blood in stool.  Genitourinary: Negative for dysuria, urgency and frequency.  Musculoskeletal: Negative for myalgias and back pain.  Skin: Negative for itching and rash.  Neurological: Positive for weakness. Negative for dizziness, tingling, tremors, focal weakness, seizures and headaches.  Psychiatric/Behavioral: Negative for depression. The patient is not nervous/anxious.     DRUG ALLERGIES:  No Known Allergies VITALS:  Blood pressure 98/72, pulse 60, temperature 97.6 F (36.4 C), temperature source Oral, resp. rate 16, height 5' (1.524 m), weight 73.392 kg (161 lb 12.8 oz), SpO2 100 %. PHYSICAL EXAMINATION:  Physical Exam  Constitutional: She is oriented to person, place, and time and well-developed, well-nourished, and in no distress.  HENT:  Head: Normocephalic and atraumatic.  Eyes: Conjunctivae and EOM are normal. Pupils are equal, round, and reactive to light.  Neck: Normal range of motion. Neck supple. No tracheal deviation present. No thyromegaly present.  Cardiovascular: Normal rate, regular rhythm and normal heart sounds.   Pulmonary/Chest: Effort normal and breath sounds  normal. No respiratory distress. She has no wheezes. She exhibits no tenderness.  Abdominal: Soft. Bowel sounds are normal. She exhibits no distension. There is no tenderness.  Musculoskeletal: Normal range of motion.  Neurological: She is alert and oriented to person, place, and time. No cranial nerve deficit.  Skin: Skin is warm and dry. No rash noted.  Psychiatric: Mood and affect normal.   LABORATORY PANEL:   CBC  Recent Labs Lab 11/09/15 0548  WBC 3.1*  HGB 10.0*  HCT 30.7*  PLT 98*   ------------------------------------------------------------------------------------------------------------------ Chemistries   Recent Labs Lab 11/08/15 2022 11/09/15 0548  NA 138 141  K 4.1 4.4  CL 111 116*  CO2 20* 21*  GLUCOSE 109* 92  BUN 21* 17  CREATININE 3.81* 3.52*  CALCIUM 8.7* 8.5*  AST 79*  --   ALT 50  --   ALKPHOS 91  --   BILITOT 0.3  --    RADIOLOGY:  Dg Esophagus  11/08/2015  CLINICAL DATA:  Loss of taste. EXAM: ESOPHOGRAM/BARIUM SWALLOW TECHNIQUE: Combined double contrast and single contrast examination performed using effervescent crystals, thick barium liquid, and thin barium liquid. FLUOROSCOPY TIME:  Radiation Exposure Index (as provided by the fluoroscopic device): 57.0 mGy COMPARISON:  No prior . FINDINGS: Prominent cricopharyngeus muscle. Cervical esophagus otherwise unremarkable. Mild narrowing of the distal esophagus. This esophageal small ulcer or diverticulum cannot be excluded. Endoscopic evaluation is suggested. No mass lesion. No reflux. Standardized barium tablet passed easily into the stomach. IMPRESSION: 1. Prominent cricopharyngeus muscle. 2. Mild distal esophageal narrowing. Small distal esophageal ulceration or diverticulum cannot be excluded. Endoscopic evaluation is suggested for further evaluation. No reflux. Standardized barium tablet passed easily into the stomach. Electronically Signed   By: Marcello Moores  Register   On:  11/08/2015 10:28   ASSESSMENT  AND PLAN:  69 year old African-American female who is presenting for abnormal kidney function  1. Acute kidney injury: Unclear etiology however likely now ATN given the long-standing history of hypotension with poor oral intake secondary to vomiting and food intolerance. continue IV fluid, monitor urine output/renal function. We'll check urinary sodium and creatinine to calculate fena, abd ultrasound showed normal kidneys, avoid further nephrotoxic agents including holding ACE inhibitor and further NSAIDs. Creat 3.8 -> 3.5  2. Hypotension with h/o Essential hypertension: hold all BP meds. BP in 90s - asymptomatic  3. Hyperlipidemia unspecified: Statin therapy  4. GERD without esophagitis: Zantac  5. Venous thromboembolism prophylactic: Heparin subcutaneous     All the records are reviewed and case discussed with Care Management/Social Worker. Management plans discussed with the patient, family and they are in agreement.  CODE STATUS: FULL CODE  TOTAL TIME TAKING CARE OF THIS PATIENT: 35 minutes.   More than 50% of the time was spent in counseling/coordination of care: YES  POSSIBLE D/C IN 1-2 DAYS, DEPENDING ON CLINICAL CONDITION.   [facility], Sheri Butler M.D on 11/09/2015 at 9:43 AM  Between 7am to 6pm - Pager - [phone]  After 6pm go to [email or web address] - password [email or web address] [facility]  Office  [phone]  CC: Primary care physician; Baltazar Apo, MD  Note: This dictation was prepared with Dragon dictation along with smaller phrase technology. Any transcriptional errors that result from this process are unintentional.

## 2015-11-09 NOTE — Plan of Care (Signed)
Problem: Education: Goal: Knowledge of [facility] Education information/materials will improve Outcome: Progressing Has received General Education Handout. Oriented to Oncology unit. Instructed and Demonstrated how to use phone to call RN and CNA for Assistance. Verbalized and Demonstrated Understanding.  Problem: Safety: Goal: Ability to remain free from injury will improve Outcome: Progressing High fall risk with bed alarm activated. Instructed and Demonstrated how to use phone to call RN and CNA for Assistance. Verbalized and Demonstrated Understanding. Calls or waits for assistance. Remained free of injury of fall this shift.  Problem: Fluid Volume: Goal: Ability to maintain a balanced intake and output will improve Outcome: Progressing Encouraged Oral Intake.  NS infusing at 153ml/hr. Nephrology consulted.

## 2015-11-09 NOTE — Progress Notes (Signed)
Initial Nutrition Assessment  DOCUMENTATION CODES:   Severe malnutrition in context of chronic illness  INTERVENTION:   Meals and Snacks: Cater to patient preferences Medical Food Supplement Therapy: will recommend Ensure Enlive po BID, each supplement provides 350 kcal and 20 grams of protein   NUTRITION DIAGNOSIS:   Malnutrition related to chronic illness as evidenced by energy intake < or equal to 75% for > or equal to 1 month, severe depletion of muscle mass, percent weight loss, moderate depletions of muscle mass.  GOAL:   Patient will meet greater than or equal to 90% of their needs  MONITOR:    (Energy Intake, Anthropometrics, Electrolyte and Renal Profile, Digestive system)  REASON FOR ASSESSMENT:   Diagnosis    ASSESSMENT:   Pt admitted with acute kidney injury and dehydration. Pt reports not tolerating certain foods with n/v for the past 7 months PTA.  Past Medical History  Diagnosis Date  . Hypertension     Diet Order:  Diet Heart Room service appropriate?: Yes; Fluid consistency:: Thin    Current Nutrition: Pt reports eating all of yogurt this am with coffee for breakfast.   Food/Nutrition-Related History: Pt reports having a poor appetite for the past 7-8 months progressively. Pt reports eating usually cereal for breakfast, bites for lunch and dinner is usually french fries with hot sauce, sometimes vegetables. Pt reports tolerating sweets mostly but not meat (chicken, beef, pork or seafood) and not tolerating mashed potatoes well but was able to eat some vegetables. Pt reports not trying soups but ordered some for dinner tonight.  Pt reports drinking [employer] protein shakes at times, likes strawberry flavor. Pt reports tolerating liquids better than solids.   RD notes pt had visit to ED 07/26/2015 with dehydration and same complaint of not tolerating foods with n/v for 4 months prior to ED visit per MD note in [facility].   Scheduled Medications:  . feeding  supplement (ENSURE ENLIVE)  237 mL Oral BID WC  . fluticasone  2 spray Each Nare Daily  . heparin  5,000 Units Subcutaneous 3 times per day  . potassium chloride SA  20 mEq Oral Daily  . pravastatin  80 mg Oral Daily    Continuous Medications:  . sodium chloride 100 mL/hr at 11/09/15 1055     Electrolyte/Renal Profile and Glucose Profile:   Recent Labs Lab 11/08/15 2022 11/09/15 0548  NA 138 141  K 4.1 4.4  CL 111 116*  CO2 20* 21*  BUN 21* 17  CREATININE 3.81* 3.52*  CALCIUM 8.7* 8.5*  GLUCOSE 109* 92   Protein Profile:   Recent Labs Lab 11/08/15 2022  ALBUMIN 3.1*    Gastrointestinal Profile: Last BM:  11/08/2015   Nutrition-Focused Physical Exam Findings: Nutrition-Focused physical exam completed. Findings are no fat depletion, mild-severe muscle depletion, and no edema.     Weight Change: Pt reports weight of over 200lbs one year ago (approximately 20% weight loss in one year). Per [facility] weight of 179lbs on admission to ED on 07/26/2015 (10% weight loss in 4 months)   Skin:  Reviewed, no issues   Height:   Ht Readings from Last 1 Encounters:  11/09/15 5' (1.524 m)    Weight:   Wt Readings from Last 1 Encounters:  11/09/15 161 lb 12.8 oz (73.392 kg)   Wt Readings from Last 10 Encounters:  11/09/15 161 lb 12.8 oz (73.392 kg)  07/26/15 179 lb (81.194 kg)    Ideal Body Weight:   45.5kg  BMI:  Body mass index is 31.6 kg/(m^2).  Estimated Nutritional Needs:   Kcal:  BEE: 896kcals, TEE: (IF 1.1-1.3)(AF 1.3) 1282-1515kcals, using IBW of 45.5kg  Protein:  45-54g protein (1.0-1.2g/kg) using IBW of 45.5kg  Fluid:  1365-1533mL of fluid (30-41mL/kg) using IBW of 45.5kg  EDUCATION NEEDS:   No education needs identified at this time   HIGH Care Level  Dwyane Luo, RD, LDN Pager [phone]

## 2015-11-10 DIAGNOSIS — E43 Unspecified severe protein-calorie malnutrition: Secondary | ICD-10-CM | POA: Insufficient documentation

## 2015-11-10 LAB — CBC
HEMATOCRIT: 26.1 % — AB (ref 35.0–47.0)
HEMOGLOBIN: 8.4 g/dL — AB (ref 12.0–16.0)
MCH: 34.1 pg — ABNORMAL HIGH (ref 26.0–34.0)
MCHC: 32.3 g/dL (ref 32.0–36.0)
MCV: 105.7 fL — AB (ref 80.0–100.0)
Platelets: 66 10*3/uL — ABNORMAL LOW (ref 150–440)
RBC: 2.47 MIL/uL — ABNORMAL LOW (ref 3.80–5.20)
RDW: 15.6 % — AB (ref 11.5–14.5)
WBC: 3.7 10*3/uL (ref 3.6–11.0)

## 2015-11-10 LAB — IRON AND TIBC
Iron: 118 ug/dL (ref 28–170)
SATURATION RATIOS: 74 % — AB (ref 10.4–31.8)
TIBC: 159 ug/dL — AB (ref 250–450)
UIBC: 41 ug/dL

## 2015-11-10 LAB — FERRITIN: Ferritin: 784 ng/mL — ABNORMAL HIGH (ref 11–307)

## 2015-11-10 LAB — EOSINOPHIL, URINE: Eosinophil, Urine: NONE SEEN %

## 2015-11-10 LAB — PHOSPHORUS: PHOSPHORUS: 2.2 mg/dL — AB (ref 2.5–4.6)

## 2015-11-10 NOTE — Consult Note (Signed)
[facility] CONSULT NOTE    Date: 11/10/2015                  Patient Name:  Sheri Butler  MRN: [id number]  DOB: 14-Feb-1946  Age / Sex: 69 y.o., female         PCP: Baltazar Apo, MD                 Service Requesting Consult: Dr. Max Sane                 Reason for Consult: Acute renal failure.            History of Present Illness: Patient is a 69 y.o. female with a PMHx of hypertension, hyperlipidemia who was admitted to [facility] on 11/08/2015 for evaluation of acute renal failure. The patient reports that over the past 7 months he's been having decreased appetite and poor by mouth intake. She recently saw her primary care physician and labs were performed. Upon presentation here creatinine was quite elevated at 3.81. Her baseline creatinine is 0.82 on 07/26/2015. Urinalysis was performed which showed a urine sodium of 11. In addition no urine eosinophils were noted. There is only mild proteinuria in the urine with a protein of 31 g/dL. No significant hematuria noted. There was mild pyuria however. Patient does have history of taking NSAIDs intermittently.   Medications: Outpatient medications: Prescriptions prior to admission  Medication Sig Dispense Refill Last Dose  . aspirin EC 81 MG tablet Take 81 mg by mouth daily.   11/08/2015 at Unknown time  . atenolol (TENORMIN) 25 MG tablet Take 25 mg by mouth daily.    11/08/2015 at 0600  . fluticasone (FLONASE) 50 MCG/ACT nasal spray Place 2 sprays into both nostrils daily.   11/08/2015 at Unknown time  . ibuprofen (ADVIL,MOTRIN) 800 MG tablet Take 1 tablet by mouth every 8 (eight) hours as needed.   PRN at PRN  . potassium chloride SA (K-DUR,KLOR-CON) 20 MEQ tablet Take 1 tablet by mouth daily.   11/08/2015 at Unknown time  . pravastatin (PRAVACHOL) 80 MG tablet Take 1 tablet by mouth daily.   11/08/2015 at Unknown time  . quinapril (ACCUPRIL) 20 MG tablet Take 1 tablet by mouth daily.   unknown at unknown  .  ondansetron (ZOFRAN) 4 MG tablet Take 1 tablet (4 mg total) by mouth daily as needed for nausea or vomiting. (Patient not taking: Reported on 11/08/2015) 20 tablet 1 Not Taking at Unknown time  . ranitidine (ZANTAC) 150 MG tablet Take 1 tablet (150 mg total) by mouth at bedtime. (Patient not taking: Reported on 11/08/2015) 30 tablet 1 Not Taking at Unknown time  . sucralfate (CARAFATE) 1 G tablet Take 1 tablet (1 g total) by mouth 4 (four) times daily. (Patient not taking: Reported on 11/08/2015) 120 tablet 1 Not Taking at Unknown time    Current medications: Current Facility-Administered Medications  Medication Dose Route Frequency Provider Last Rate Last Dose  . 0.9 %  sodium chloride infusion   Intravenous Continuous Lytle Butte, MD 100 mL/hr at 11/10/15 0615    . acetaminophen (TYLENOL) tablet 650 mg  650 mg Oral Q6H PRN Lytle Butte, MD       Or  . acetaminophen (TYLENOL) suppository 650 mg  650 mg Rectal Q6H PRN Lytle Butte, MD      . feeding supplement (ENSURE ENLIVE) (ENSURE ENLIVE) liquid 237 mL  237 mL Oral BID WC Max Sane, MD  237 mL at 11/10/15 0800  . fluticasone (FLONASE) 50 MCG/ACT nasal spray 2 spray  2 spray Each Nare Daily Lytle Butte, MD   2 spray at 11/09/15 0920  . morphine 2 MG/ML injection 2 mg  2 mg Intravenous Q4H PRN Lytle Butte, MD      . ondansetron [facility]) tablet 4 mg  4 mg Oral Q6H PRN Lytle Butte, MD       Or  . ondansetron [facility]) injection 4 mg  4 mg Intravenous Q6H PRN Lytle Butte, MD      . oxyCODONE (Oxy IR/ROXICODONE) immediate release tablet 5 mg  5 mg Oral Q4H PRN Lytle Butte, MD      . potassium chloride SA (K-DUR,KLOR-CON) CR tablet 20 mEq  20 mEq Oral Daily Lytle Butte, MD   20 mEq at 11/10/15 0857  . pravastatin (PRAVACHOL) tablet 80 mg  80 mg Oral Daily Lytle Butte, MD   80 mg at 11/10/15 [id number]      Allergies: No Known Allergies    Past Medical History: Past Medical History  Diagnosis Date  . Hypertension      Past  Surgical History: History reviewed. No pertinent past surgical history.   Family History: Family History  Problem Relation Age of Onset  . Hypertension Other      Social History: Social History   Social History  . Marital Status: Married    Spouse Name: N/A  . Number of Children: N/A  . Years of Education: N/A   Occupational History  . Not on file.   Social History Main Topics  . Smoking status: Never Smoker   . Smokeless tobacco: Not on file  . Alcohol Use: Yes     Comment: everyday  . Drug Use: No  . Sexual Activity: Not on file   Other Topics Concern  . Not on file   Social History Narrative     Review of Systems: Review of Systems  Constitutional: Positive for weight loss. Negative for fever, chills, malaise/fatigue and diaphoresis.  HENT: Negative for congestion, hearing loss and nosebleeds.   Eyes: Negative for blurred vision and double vision.  Respiratory: Negative for cough, hemoptysis and sputum production.   Cardiovascular: Negative for chest pain, palpitations and orthopnea.  Gastrointestinal: Positive for nausea and vomiting. Negative for heartburn, abdominal pain and diarrhea.  Genitourinary: Negative for dysuria, urgency and frequency.  Musculoskeletal: Negative for myalgias, back pain and joint pain.  Neurological: Negative for dizziness, speech change, focal weakness and headaches.  Endo/Heme/Allergies: Does not bruise/bleed easily.  Psychiatric/Behavioral: Negative for depression. The patient is not nervous/anxious.      Vital Signs: Blood pressure 113/77, pulse 57, temperature 97.7 F (36.5 C), temperature source Oral, resp. rate 20, height 5' (1.524 m), weight 73.392 kg (161 lb 12.8 oz), SpO2 100 %.  Weight trends: Filed Weights   11/08/15 1751 11/09/15 0009  Weight: 74.39 kg (164 lb) 73.392 kg (161 lb 12.8 oz)    Physical Exam: General: NAD, sitting up in bed  Head: Normocephalic, atraumatic.  Eyes: Anicteric, EOMI  Nose: Mucous  membranes moist, not inflammed, nonerythematous.  Throat: Oropharynx nonerythematous, no exudate appreciated.   Neck: Supple, trachea midline.  Lungs:  Normal respiratory effort. Clear to auscultation BL without crackles or wheezes.  Heart: RRR. S1 and S2 normal without gallop, murmur, or rubs.  Abdomen:  BS normoactive. Soft, Nondistended, non-tender.  No masses or organomegaly.  Extremities: No pretibial edema.  Neurologic: A&O X3,  Motor strength is 5/5 in the all 4 extremities  Skin: No visible rashes, scars.    Lab results: Basic Metabolic Panel:  Recent Labs Lab 11/08/15 2022 11/09/15 0548  NA 138 141  K 4.1 4.4  CL 111 116*  CO2 20* 21*  GLUCOSE 109* 92  BUN 21* 17  CREATININE 3.81* 3.52*  CALCIUM 8.7* 8.5*    Liver Function Tests:  Recent Labs Lab 11/08/15 2022  AST 79*  ALT 50  ALKPHOS 91  BILITOT 0.3  PROT 6.5  ALBUMIN 3.1*   No results for input(s): LIPASE, AMYLASE in the last 168 hours. No results for input(s): AMMONIA in the last 168 hours.  CBC:  Recent Labs Lab 11/08/15 1808 11/09/15 0548 11/10/15 0430  WBC 3.8 3.1* 3.7  HGB 10.7* 10.0* 8.4*  HCT 33.3* 30.7* 26.1*  MCV 106.6* 106.9* 105.7*  PLT 140* 98* 66*    Cardiac Enzymes: No results for input(s): CKTOTAL, CKMB, CKMBINDEX, TROPONINI in the last 168 hours.  BNP: Invalid input(s): POCBNP  CBG: No results for input(s): GLUCAP in the last 168 hours.  Microbiology: No results found for this or any previous visit.  Coagulation Studies: No results for input(s): LABPROT, INR in the last 72 hours.  Urinalysis:  Recent Labs  11/08/15 2221  COLORURINE YELLOW*  LABSPEC 1.006  PHURINE 6.0  GLUCOSEU NEGATIVE  HGBUR 1+*  BILIRUBINUR NEGATIVE  KETONESUR NEGATIVE  PROTEINUR 30*  NITRITE NEGATIVE  LEUKOCYTESUR 2+*      Imaging:  No results found.   Assessment & Plan: Pt is a 69 y.o. female with a PMHx of hypertension, hyperlipidemia who was admitted to [facility] on 11/08/2015  for evaluation of acute renal failure.   1.  Acute renal failure, etiology unclear. 2.  Mild proteinuria. 3.  Anemia unspecified. 4.  Weight loss.  Plan:  The patient presents with an interesting case. She's had poor appetite over the past 7 months. Exact etiology of her acute renal failure currently unclear. She has associated proteinuria as well as anemia. Differential diagnosis quite extensive. Obtain renal ultrasound, SPEP, UPEP, ANA, ANCA antibodies, GBM antibodies, C3, C4, serum iron studies. Continue IV fluid hydration for now and avoid nephrotoxins as possible. No acute indication for dialysis as renal function appears to be improving at present. Continue to monitor renal function daily. Avoid any further NSAIDs as the patient was taking these intermittently in the past. Thank you for consultation.

## 2015-11-10 NOTE — Progress Notes (Signed)
[facility] [place] NAME: Sheri Butler    MR#:  [id number]  DATE OF BIRTH:  [birth year]  SUBJECTIVE:  CHIEF COMPLAINT:   Chief Complaint  Patient presents with  . Dehydration  Resting comfortably, no new s/s , denies abd pain  REVIEW OF SYSTEMS:  Review of Systems  Constitutional: Negative for fever, weight loss, malaise/fatigue and diaphoresis.  HENT: Negative for ear discharge, ear pain, hearing loss, nosebleeds, sore throat and tinnitus.   Eyes: Negative for blurred vision and pain.  Respiratory: Negative for cough, hemoptysis, shortness of breath and wheezing.   Cardiovascular: Negative for chest pain, palpitations, orthopnea and leg swelling.  Gastrointestinal: Negative for heartburn, nausea, vomiting, abdominal pain, diarrhea, constipation and blood in stool.  Genitourinary: Negative for dysuria, urgency and frequency.  Musculoskeletal: Negative for myalgias and back pain.  Skin: Negative for itching and rash.  Neurological: Positive for weakness. Negative for dizziness, tingling, tremors, focal weakness, seizures and headaches.  Psychiatric/Behavioral: Negative for depression. The patient is not nervous/anxious.     DRUG ALLERGIES:  No Known Allergies VITALS:  Blood pressure 101/61, pulse 65, temperature 98.6 F (37 C), temperature source Oral, resp. rate 20, height 5' (1.524 m), weight 73.392 kg (161 lb 12.8 oz), SpO2 100 %. PHYSICAL EXAMINATION:  Physical Exam  Constitutional: She is oriented to person, place, and time and well-developed, well-nourished, and in no distress.  HENT:  Head: Normocephalic and atraumatic.  Eyes: Conjunctivae and EOM are normal. Pupils are equal, round, and reactive to light.  Neck: Normal range of motion. Neck supple. No tracheal deviation present. No thyromegaly present.  Cardiovascular: Normal rate, regular rhythm and normal heart sounds.   Pulmonary/Chest: Effort normal and breath  sounds normal. No respiratory distress. She has no wheezes. She exhibits no tenderness.  Abdominal: Soft. Bowel sounds are normal. She exhibits no distension. There is no tenderness.  Musculoskeletal: Normal range of motion.  Neurological: She is alert and oriented to person, place, and time. No cranial nerve deficit.  Skin: Skin is warm and dry. No rash noted.  Psychiatric: Mood and affect normal.   LABORATORY PANEL:   CBC  Recent Labs Lab 11/10/15 0430  WBC 3.7  HGB 8.4*  HCT 26.1*  PLT 66*   ------------------------------------------------------------------------------------------------------------------ Chemistries   Recent Labs Lab 11/08/15 2022 11/09/15 0548  NA 138 141  K 4.1 4.4  CL 111 116*  CO2 20* 21*  GLUCOSE 109* 92  BUN 21* 17  CREATININE 3.81* 3.52*  CALCIUM 8.7* 8.5*  AST 79*  --   ALT 50  --   ALKPHOS 91  --   BILITOT 0.3  --    RADIOLOGY:  No results found. ASSESSMENT AND PLAN:  69 year old African-American female who is presenting for abnormal kidney function  1. Acute kidney injury with proteinnuria and anemia : Unclear etiology however likely now ATN given the long-standing history of hypotension with poor oral intake secondary to vomiting and food intolerance. Nephro has ordered SPEP,UPEP, C3, C4,GBM ab,ANA ,ANCA and iron studies  Appreciate nephro recommendations  continue IV fluid, monitor urine output/renal function.  abd ultrasound showed normal kidneys  avoid further nephrotoxic agents including holding ACE inhibitor and further NSAIDs. Creat 3.8 -> 3.5  2. Hypotension with h/o Essential hypertension: hold all BP meds. BP in 90s - asymptomatic  3. Hyperlipidemia unspecified: Statin therapy  4. GERD without esophagitis: Zantac  5. Venous thromboembolism prophylactic: Heparin subcutaneous     All the records  are reviewed and case discussed with Care Management/Social Worker. Management plans discussed with the patient, family and  they are in agreement.  CODE STATUS: FULL CODE  TOTAL TIME TAKING CARE OF THIS PATIENT: 35 minutes.   More than 50% of the time was spent in counseling/coordination of care: YES  POSSIBLE D/C IN 1-2 DAYS, DEPENDING ON CLINICAL CONDITION.   Nicholes Mango M.D on 11/10/2015 at 10:55 PM  Between 7am to 6pm - Pager - [phone]  After 6pm go to [email or web address] - password [email or web address] [facility]  Office  [phone]  CC: Primary care physician; Baltazar Apo, MD  Note: This dictation was prepared with Dragon dictation along with smaller phrase technology. Any transcriptional errors that result from this process are unintentional.

## 2015-11-10 NOTE — Plan of Care (Signed)
Problem: Education: Goal: Knowledge of [place] [facility] Education information/materials will improve Outcome: Progressing Has received General Education Handout. Oriented to Oncology unit. Instructed and Demonstrated how to use phone to call RN and CNA for Assistance. Verbalized and Demonstrated Understanding.     Problem: Safety: Goal: Ability to remain free from injury will improve Outcome: Progressing High fall risk with bed alarm activated. Instructed and Demonstrated how to use phone to call RN and CNA for Assistance. Verbalized and Demonstrated Understanding. Calls or waits for assistance. Remained free of injury of fall this shift.  Problem: Fluid Volume: Goal: Ability to maintain a balanced intake and output will improve Outcome: Progressing Encouraged Oral Intake.   NS infusing at 14ml/hr. Nephrology consulted.

## 2015-11-10 NOTE — Plan of Care (Signed)
Problem: Fluid Volume: Goal: Ability to maintain a balanced intake and output will improve Outcome: Progressing Plan of care, porgress to goals. 1. VSS, no pain, pt ambulating in halls. 2. Awaiting nephrology consult. 3. RD consult completed. Dorna Bloom RN

## 2015-11-11 LAB — BASIC METABOLIC PANEL
ANION GAP: 2 — AB (ref 5–15)
BUN: 10 mg/dL (ref 6–20)
CHLORIDE: 121 mmol/L — AB (ref 101–111)
CO2: 19 mmol/L — AB (ref 22–32)
Calcium: 7.8 mg/dL — ABNORMAL LOW (ref 8.9–10.3)
Creatinine, Ser: 1.57 mg/dL — ABNORMAL HIGH (ref 0.44–1.00)
GFR calc Af Amer: 37 mL/min — ABNORMAL LOW (ref >60)
GFR calc non Af Amer: 32 mL/min — ABNORMAL LOW (ref >60)
GLUCOSE: 86 mg/dL (ref 65–99)
POTASSIUM: 4.6 mmol/L (ref 3.5–5.1)
Sodium: 142 mmol/L (ref 135–145)

## 2015-11-11 LAB — CBC
HEMATOCRIT: 26 % — AB (ref 35.0–47.0)
HEMOGLOBIN: 8.3 g/dL — AB (ref 12.0–16.0)
MCH: 34.2 pg — AB (ref 26.0–34.0)
MCHC: 32 g/dL (ref 32.0–36.0)
MCV: 107 fL — ABNORMAL HIGH (ref 80.0–100.0)
Platelets: 77 10*3/uL — ABNORMAL LOW (ref 150–440)
RBC: 2.43 MIL/uL — ABNORMAL LOW (ref 3.80–5.20)
RDW: 15.3 % — ABNORMAL HIGH (ref 11.5–14.5)
WBC: 3.1 10*3/uL — ABNORMAL LOW (ref 3.6–11.0)

## 2015-11-11 NOTE — Progress Notes (Signed)
[facility]  ROUNDING NOTE   Subjective:  Patient doing better today. Creatinine down to 1.57. Question if urine output is accurate.   Objective:  Vital signs in last 24 hours:  Temp:  [97.7 F (36.5 C)-98.6 F (37 C)] 98.4 F (36.9 C) (12/04 0453) Pulse Rate:  [57-65] 58 (12/04 0453) Resp:  [19-20] 19 (12/04 0453) BP: (101-113)/(61-77) 103/62 mmHg (12/04 0453) SpO2:  [100 %] 100 % (12/04 0453)  Weight change:  Filed Weights   11/08/15 1751 11/09/15 0009  Weight: 74.39 kg (164 lb) 73.392 kg (161 lb 12.8 oz)    Intake/Output: I/O last 3 completed shifts: In: 2750 [P.O.:720; I.V.:2030] Out: 475 [Urine:475]   Intake/Output this shift:  Total I/O In: 240 [P.O.:240] Out: -   Physical Exam: General: NAD, resting in bd  Head: Normocephalic, atraumatic. Moist oral mucosal membranes  Eyes: Anicteric  Neck: Supple, trachea midline  Lungs:  Clear to auscultation normal effort  Heart: Regular rate and rhythm  Abdomen:  Soft, nontender, BS present  Extremities: no peripheral edema.  Neurologic: Nonfocal, moving all four extremities  Skin: No lesions       Basic Metabolic Panel:  Recent Labs Lab 11/08/15 2022 11/09/15 0548 11/10/15 1604 11/11/15 0416  NA 138 141  --  142  K 4.1 4.4  --  4.6  CL 111 116*  --  121*  CO2 20* 21*  --  19*  GLUCOSE 109* 92  --  86  BUN 21* 17  --  10  CREATININE 3.81* 3.52*  --  1.57*  CALCIUM 8.7* 8.5*  --  7.8*  PHOS  --   --  2.2*  --     Liver Function Tests:  Recent Labs Lab 11/08/15 2022  AST 79*  ALT 50  ALKPHOS 91  BILITOT 0.3  PROT 6.5  ALBUMIN 3.1*   No results for input(s): LIPASE, AMYLASE in the last 168 hours. No results for input(s): AMMONIA in the last 168 hours.  CBC:  Recent Labs Lab 11/08/15 1808 11/09/15 0548 11/10/15 0430 11/11/15 0416  WBC 3.8 3.1* 3.7 3.1*  HGB 10.7* 10.0* 8.4* 8.3*  HCT 33.3* 30.7* 26.1* 26.0*  MCV 106.6* 106.9* 105.7* 107.0*  PLT 140* 98* 66* 77*     Cardiac Enzymes: No results for input(s): CKTOTAL, CKMB, CKMBINDEX, TROPONINI in the last 168 hours.  BNP: Invalid input(s): POCBNP  CBG: No results for input(s): GLUCAP in the last 168 hours.  Microbiology: No results found for this or any previous visit.  Coagulation Studies: No results for input(s): LABPROT, INR in the last 72 hours.  Urinalysis:  Recent Labs  11/08/15 2221  COLORURINE YELLOW*  LABSPEC 1.006  PHURINE 6.0  GLUCOSEU NEGATIVE  HGBUR 1+*  BILIRUBINUR NEGATIVE  KETONESUR NEGATIVE  PROTEINUR 30*  NITRITE NEGATIVE  LEUKOCYTESUR 2+*      Imaging: No results found.   Medications:   . sodium chloride 100 mL/hr at 11/11/15 1149   . feeding supplement (ENSURE ENLIVE)  237 mL Oral BID WC  . fluticasone  2 spray Each Nare Daily  . potassium chloride SA  20 mEq Oral Daily  . pravastatin  80 mg Oral Daily   acetaminophen **OR** acetaminophen, morphine injection, ondansetron **OR** ondansetron (ZOFRAN) IV, oxyCODONE  Assessment/ Plan:  69 y.o. female with a PMHx of hypertension, hyperlipidemia who was admitted to [facility] on 11/08/2015 for evaluation of acute renal failure.   1. Acute renal failure, etiology unclear. 2. Mild proteinuria. 3. Anemia unspecified.  4. Weight loss.  PLAN: enal function appears to have improved significantly.  Creatinine down to 1.57.  Serologic workup however is pending.  Given improvement in renal function we would recommend continuation of 0.9 normal saline at 100 cc hours for at least one additional day.  The reason for her anorexiaandweight loss however is still unclear at this point in time.  She will likely require further workup for this issue but this could be performed on an outpatient basis.  Followup renal parameters tomorrow and avoid nephrotoxins as possible.   LOS: 3 Yandriel Boening 12/4/201612:52 PM

## 2015-11-11 NOTE — Progress Notes (Signed)
[place] [facility] [place] NAME: Sheri Butler    MR#:  [id number]  DATE OF BIRTH:  [birth year]  SUBJECTIVE:  CHIEF COMPLAINT:   Chief Complaint  Patient presents with  . Dehydration  Resting comfortably, no new s/s , denies abd pain  REVIEW OF SYSTEMS:  Review of Systems  Constitutional: Positive for weight loss. Negative for fever, malaise/fatigue and diaphoresis.  HENT: Negative for ear discharge, ear pain, hearing loss, nosebleeds, sore throat and tinnitus.   Eyes: Negative for blurred vision and pain.  Respiratory: Negative for cough, hemoptysis, shortness of breath and wheezing.   Cardiovascular: Negative for chest pain, palpitations, orthopnea and leg swelling.  Gastrointestinal: Negative for heartburn, nausea, vomiting, abdominal pain, diarrhea, constipation and blood in stool.  Genitourinary: Negative for dysuria, urgency and frequency.  Musculoskeletal: Negative for myalgias and back pain.  Skin: Negative for itching and rash.  Neurological: Positive for weakness. Negative for dizziness, tingling, tremors, focal weakness, seizures and headaches.  Psychiatric/Behavioral: Negative for depression. The patient is not nervous/anxious.     DRUG ALLERGIES:  No Known Allergies VITALS:  Blood pressure 103/62, pulse 58, temperature 98.4 F (36.9 C), temperature source Oral, resp. rate 19, height 5' (1.524 m), weight 73.392 kg (161 lb 12.8 oz), SpO2 100 %. PHYSICAL EXAMINATION:  Physical Exam  Constitutional: She is oriented to person, place, and time and well-developed, well-nourished, and in no distress.  HENT:  Head: Normocephalic and atraumatic.  Eyes: Conjunctivae and EOM are normal. Pupils are equal, round, and reactive to light.  Neck: Normal range of motion. Neck supple. No tracheal deviation present. No thyromegaly present.  Cardiovascular: Normal rate, regular rhythm and normal heart sounds.   Pulmonary/Chest: Effort  normal and breath sounds normal. No respiratory distress. She has no wheezes. She exhibits no tenderness.  Abdominal: Soft. Bowel sounds are normal. She exhibits no distension. There is no tenderness.  Musculoskeletal: Normal range of motion.  Neurological: She is alert and oriented to person, place, and time. No cranial nerve deficit.  Skin: Skin is warm and dry. No rash noted.  Psychiatric: Mood and affect normal.   LABORATORY PANEL:   CBC  Recent Labs Lab 11/11/15 0416  WBC 3.1*  HGB 8.3*  HCT 26.0*  PLT 77*   ------------------------------------------------------------------------------------------------------------------ Chemistries   Recent Labs Lab 11/08/15 2022  11/11/15 0416  NA 138  < > 142  K 4.1  < > 4.6  CL 111  < > 121*  CO2 20*  < > 19*  GLUCOSE 109*  < > 86  BUN 21*  < > 10  CREATININE 3.81*  < > 1.57*  CALCIUM 8.7*  < > 7.8*  AST 79*  --   --   ALT 50  --   --   ALKPHOS 91  --   --   BILITOT 0.3  --   --   < > = values in this interval not displayed. RADIOLOGY:  No results found. ASSESSMENT AND PLAN:  69 year old African-American female who is presenting for abnormal kidney function  1. Acute kidney injury with proteinnuria and anemia : Unclear etiology however likely now ATN given the long-standing history of hypotension with poor oral intake secondary to vomiting and food intolerance. Nephro has ordered SPEP,UPEP, C3, C4,GBM ab,ANA ,ANCA and iron studies  Appreciate nephro recommendations  continue IV fluid, monitor urine output/renal function.  abd ultrasound showed normal kidneys  avoid further nephrotoxic agents including holding ACE inhibitor and further  NSAIDs. Creat 3.8 -> 3.5-->1.5 Check a.m. Labs  2. Anorexia and weight loss  patient is to follow-up with primary care physician as an outpatient for further evaluation and workup   2. Hypotension with h/o Essential hypertension: hold all BP meds. BP in 100's - asymptomatic   3.  Hyperlipidemia unspecified: Statin therapy  4. GERD without esophagitis: Zantac  5. Venous thromboembolism prophylactic: Heparin subcutaneous     All the records are reviewed and case discussed with Care Management/Social Worker. Management plans discussed with the patient, family and they are in agreement.  CODE STATUS: FULL CODE  TOTAL TIME TAKING CARE OF THIS PATIENT: 35 minutes.   More than 50% of the time was spent in counseling/coordination of care: YES  POSSIBLE D/C IN 1-2 DAYS, DEPENDING ON CLINICAL CONDITION.   Nicholes Mango M.D on 11/11/2015 at 2:27 PM  Between 7am to 6pm - Pager - [phone]  After 6pm go to [email or web address] - password [email or web address] [facility]  Office  [phone]  CC: Primary care physician; Baltazar Apo, MD  Note: This dictation was prepared with Dragon dictation along with smaller phrase technology. Any transcriptional errors that result from this process are unintentional.

## 2015-11-11 NOTE — Plan of Care (Signed)
Problem: Safety: Goal: Ability to remain free from injury will improve Outcome: Progressing Patient is moderate fall risk.  Patient is steady on feet and plantar and dorsiflexion are strong. Patient up with stand-by assist for safety in room and to bathroom.  Bed alarm on throughout shift. Patient compliant with calling for assistance. Patient without injury this shift.   Problem: Fluid Volume: Goal: Ability to maintain a balanced intake and output will improve Outcome: Progressing Patient admitted for dehydration and AKI. IVF .9 NS running at 100 mL/hr. Nephrology consulted.  Patient encouraged to increase oral intake this shift.

## 2015-11-11 NOTE — Plan of Care (Signed)
Problem: Safety: Goal: Ability to remain free from injury will improve Outcome: Progressing Pt remained free of injury during the shift.  Problem: Fluid Volume: Goal: Ability to maintain a balanced intake and output will improve Outcome: Progressing VSS. IVF continues. BUN 10, creatinine 1.57. Awaiting discharge tomorrow.

## 2015-11-12 LAB — PROTEIN ELECTRO, RANDOM URINE
Albumin ELP, Urine: 17.7 %
Alpha-1-Globulin, U: 5.3 %
Alpha-2-Globulin, U: 28.9 %
BETA GLOBULIN, U: 26.2 %
GAMMA GLOBULIN, U: 21.9 %
M SPIKE UR: 15.9 % — AB
Total Protein, Urine: 51.5 mg/dL

## 2015-11-12 LAB — BASIC METABOLIC PANEL
ANION GAP: 2 — AB (ref 5–15)
BUN: 9 mg/dL (ref 6–20)
CALCIUM: 8.1 mg/dL — AB (ref 8.9–10.3)
CO2: 22 mmol/L (ref 22–32)
Chloride: 121 mmol/L — ABNORMAL HIGH (ref 101–111)
Creatinine, Ser: 1.26 mg/dL — ABNORMAL HIGH (ref 0.44–1.00)
GFR calc Af Amer: 49 mL/min — ABNORMAL LOW (ref >60)
GFR, EST NON AFRICAN AMERICAN: 42 mL/min — AB (ref >60)
GLUCOSE: 83 mg/dL (ref 65–99)
POTASSIUM: 3.9 mmol/L (ref 3.5–5.1)
SODIUM: 145 mmol/L (ref 135–145)

## 2015-11-12 LAB — CBC
HCT: 25.4 % — ABNORMAL LOW (ref 35.0–47.0)
Hemoglobin: 8.4 g/dL — ABNORMAL LOW (ref 12.0–16.0)
MCH: 35.2 pg — ABNORMAL HIGH (ref 26.0–34.0)
MCHC: 33 g/dL (ref 32.0–36.0)
MCV: 106.6 fL — AB (ref 80.0–100.0)
PLATELETS: 89 10*3/uL — AB (ref 150–440)
RBC: 2.39 MIL/uL — AB (ref 3.80–5.20)
RDW: 15.3 % — AB (ref 11.5–14.5)
WBC: 3.6 10*3/uL (ref 3.6–11.0)

## 2015-11-12 MED ORDER — ACETAMINOPHEN 325 MG PO TABS: 650.0000 mg | ORAL_TABLET | Freq: Four times a day (QID) | ORAL | Status: DC | PRN

## 2015-11-12 MED ORDER — ENSURE ENLIVE PO LIQD: 237.0000 mL | Freq: Two times a day (BID) | ORAL | Status: DC

## 2015-11-12 NOTE — Discharge Instructions (Signed)
Acute Kidney Injury °Acute kidney injury is any condition in which there is sudden (acute) damage to the kidneys. Acute kidney injury was previously known as acute kidney failure or acute renal failure. The kidneys are two organs that lie on either side of the spine between the middle of the back and the front of the abdomen. The kidneys: °· Remove wastes and extra water from the blood.   °· Produce important hormones. These help keep bones strong, regulate blood pressure, and help create red blood cells.   °· Balance the fluids and chemicals in the blood and tissues. °A small amount of kidney damage may not cause problems, but a large amount of damage may make it difficult or impossible for the kidneys to work the way they should. Acute kidney injury may develop into long-lasting (chronic) kidney disease. It may also develop into a life-threatening disease called end-stage kidney disease. Acute kidney injury can get worse very quickly, so it should be treated right away. Early treatment may prevent other kidney diseases from developing. °CAUSES  °· A problem with blood flow to the kidneys. This may be caused by:   °¨ Blood loss.   °¨ Heart disease.   °¨ Severe burns.   °¨ Liver disease. °· Direct damage to the kidneys. This may be caused by: °¨ Some medicines.   °¨ A kidney infection.   °¨ Poisoning or consuming toxic substances.   °¨ A surgical wound.   °¨ A blow to the kidney area.   °· A problem with urine flow. This may be caused by:   °¨ Cancer.   °¨ Kidney stones.   °¨ An enlarged prostate. °SIGNS AND SYMPTOMS  °· Swelling (edema) of the legs, ankles, or feet.   °· Tiredness (lethargy).   °· Nausea or vomiting.   °· Confusion.   °· Problems with urination, such as:   °¨ Painful or burning feeling during urination.   °¨ Decreased urine production.   °¨ Frequent accidents in children who are potty trained.   °¨ Bloody urine.   °· Muscle twitches and cramps.   °· Shortness of breath.   °· Seizures.   °· Chest  pain or pressure. °Sometimes, no symptoms are present.  °DIAGNOSIS °Acute kidney injury may be detected and diagnosed by tests, including blood, urine, imaging, or kidney biopsy tests.  °TREATMENT °Treatment of acute kidney injury varies depending on the cause and severity of the kidney damage. In mild cases, no treatment may be needed. The kidneys may heal on their own. If acute kidney injury is more severe, your health care provider will treat the cause of the kidney damage, help the kidneys heal, and prevent complications from occurring. Severe cases may require a procedure to remove toxic wastes from the body (dialysis) or surgery to repair kidney damage. Surgery may involve:  °· Repair of a torn kidney.   °· Removal of an obstruction. °HOME CARE INSTRUCTIONS °· Follow your prescribed diet. °· Take medicines only as directed by your health care provider.  °· Do not take any new medicines (prescription, over-the-counter, or nutritional supplements) unless approved by your health care provider. Many medicines can worsen your kidney damage or may need to have the dose adjusted.   °· Keep all follow-up visits as directed by your health care provider. This is important. °· Observe your condition to make sure you are healing as expected. °SEEK IMMEDIATE MEDICAL CARE IF: °· You are feeling ill or have severe pain in the back or side.   °· Your symptoms return or you have new symptoms. °· You have any symptoms of end-stage kidney disease. These include:   °¨ Persistent itchiness.   °¨   Loss of appetite.   Headaches.   Abnormally dark or light skin.  Numbness in the hands or feet.   Easy bruising.   Frequent hiccups.   Menstruation stops.   You have a fever.  You have increased urine production.  You have pain or bleeding when urinating. MAKE SURE YOU:   Understand these instructions.  Will watch your condition.  Will get help right away if you are not doing well or get worse.   This  information is not intended to replace advice given to you by your health care provider. Make sure you discuss any questions you have with your health care provider.   Document Released: 06/09/2011 Document Revised: 12/15/2014 Document Reviewed: 07/23/2012 Elsevier Interactive Patient Education 2016 [employer].   Activity as tolerated, Diet low-salt and low-fat Follow-up with primary care physician in a week Follow-up with nephrology at [facility] in 3-5 days

## 2015-11-12 NOTE — Care Management Important Message (Signed)
Important Message  Patient Details  Name: Sheri Butler MRN: [id number] Date of Birth: 13-Oct-1946   Medicare Important Message Given:  Yes    Juliann Pulse A Lakea Mittelman 11/12/2015, 10:48 AM

## 2015-11-12 NOTE — Progress Notes (Signed)
Removed piv.   Patient alert and oriented.  No complaints of pain.  Independent in room.  Went over d/c paperwork and follow up appts.  No questions at this time.  Son to pick up patient and she will be escorted out of hospital via wheelchair by volunteers.

## 2015-11-12 NOTE — Plan of Care (Signed)
Problem: Safety: Goal: Ability to remain free from injury will improve Outcome: Progressing Patient is moderate fall risk. Patient is steady on feet and plantar and dorsiflexion are strong. Patient up with stand-by assist for safety in room and to bathroom.Patient refusing bed alarm. Patient compliant with calling for assistance. Patient without injury this shift.   Problem: Fluid Volume: Goal: Ability to maintain a balanced intake and output will improve Outcome: Progressing Patient admitted for dehydration and AKI. IVF .9 NS running at 100 mL/hr.  Patient encouraged to increase oral intake this shift.

## 2015-11-12 NOTE — Discharge Summary (Signed)
[place] at [place] NAME: Sheri Butler    MR#:  [id number]  DATE OF BIRTH:  [birth year]  DATE OF ADMISSION:  11/08/2015 ADMITTING PHYSICIAN: Lytle Butte, MD  DATE OF DISCHARGE: 11/12/2015 PRIMARY CARE PHYSICIAN: Baltazar Apo, MD    ADMISSION DIAGNOSIS:  AKI (acute kidney injury) ([place]) [N17.9] Acute renal failure, unspecified acute renal failure type ([place]) [N17.9]  DISCHARGE DIAGNOSIS:  Principal Problem:   Acute kidney injury ([place]) Active Problems:   Protein-calorie malnutrition, severe   SECONDARY DIAGNOSIS:   Past Medical History  Diagnosis Date  . Hypertension     HOSPITAL COURSE:  69 year old African-American female who is presenting for abnormal kidney function, please review H&P for details  1. Acute kidney injury with proteinnuria and anemia :  Prerenal leading to ATN given the long-standing history of hypotension with poor oral intake secondary to vomiting and food intolerance. Op F/u with nephro for pending SPEP,UPEP, C3, C4,GBM ab,ANA ,ANCA  Appreciate nephro recommendations Improved with IV fluid, monitored urine output/renal function.  abd ultrasound showed normal kidneys avoid further nephrotoxic agents including holding ACE inhibitor and further NSAIDs. Creat 3.8 -> 3.5-->1.5   2. Anorexia and weight loss patient is to follow-up with primary care physician as an outpatient for further evaluation and workup   2. Hypotension with h/o Essential hypertension: held all BP meds during hospital course, . BP improved with ivf,  Asymptomatic Resumed home med atenolol and d/ced quinapril   3. Hyperlipidemia unspecified: Statin therapy  4. GERD without esophagitis: Zantac  5. Venous thromboembolism prophylactic: Heparin subcutaneous         DISCHARGE CONDITIONS:   fair  CONSULTS OBTAINED:  Treatment Team:  Lytle Butte, MD Munsoor Holley Raring, MD Nicholes Mango, MD   PROCEDURES none  DRUG  ALLERGIES:  No Known Allergies  DISCHARGE MEDICATIONS:   Current Discharge Medication List    START taking these medications   Details  acetaminophen (TYLENOL) 325 MG tablet Take 2 tablets (650 mg total) by mouth every 6 (six) hours as needed for mild pain (or Fever >/= 101).    feeding supplement, ENSURE ENLIVE, (ENSURE ENLIVE) LIQD Take 237 mLs by mouth 2 (two) times daily with a meal. Qty: 237 mL, Refills: 12      CONTINUE these medications which have NOT CHANGED   Details  aspirin EC 81 MG tablet Take 81 mg by mouth daily.    atenolol (TENORMIN) 25 MG tablet Take 25 mg by mouth daily.     fluticasone (FLONASE) 50 MCG/ACT nasal spray Place 2 sprays into both nostrils daily.    pravastatin (PRAVACHOL) 80 MG tablet Take 1 tablet by mouth daily.    ondansetron (ZOFRAN) 4 MG tablet Take 1 tablet (4 mg total) by mouth daily as needed for nausea or vomiting. Qty: 20 tablet, Refills: 1    ranitidine (ZANTAC) 150 MG tablet Take 1 tablet (150 mg total) by mouth at bedtime. Qty: 30 tablet, Refills: 1    sucralfate (CARAFATE) 1 G tablet Take 1 tablet (1 g total) by mouth 4 (four) times daily. Qty: 120 tablet, Refills: 1      STOP taking these medications     ibuprofen (ADVIL,MOTRIN) 800 MG tablet      potassium chloride SA (K-DUR,KLOR-CON) 20 MEQ tablet      quinapril (ACCUPRIL) 20 MG tablet          DISCHARGE INSTRUCTIONS:   Activity as tolerated, Diet low-salt and low-fat Follow-up with  primary care physician in a week Follow-up with nephrology at [facility] in 3-5 days   DIET:  Low salt and low fat   DISCHARGE CONDITION:  fair  ACTIVITY:  As tolerated}  OXYGEN:  Home Oxygen: no   Oxygen Delivery: ra  DISCHARGE LOCATION:  home  If you experience worsening of your admission symptoms, develop shortness of breath, life threatening emergency, suicidal or homicidal thoughts you must seek medical attention immediately by calling 911 or calling your MD  immediately  if symptoms less severe.  You Must read complete instructions/literature along with all the possible adverse reactions/side effects for all the Medicines you take and that have been prescribed to you. Take any new Medicines after you have completely understood and accpet all the possible adverse reactions/side effects.   Please note  You were cared for by a hospitalist during your hospital stay. If you have any questions about your discharge medications or the care you received while you were in the hospital after you are discharged, you can call the unit and asked to speak with the hospitalist on call if the hospitalist that took care of you is not available. Once you are discharged, your primary care physician will handle any further medical issues. Please note that NO REFILLS for any discharge medications will be authorized once you are discharged, as it is imperative that you return to your primary care physician (or establish a relationship with a primary care physician if you do not have one) for your aftercare needs so that they can reassess your need for medications and monitor your lab values.     Today  Chief Complaint  Patient presents with  . Dehydration   Pt is feeling fine, no complaints  ROS:  CONSTITUTIONAL: Denies fevers, chills. Denies any fatigue, weakness.  EYES: Denies blurry vision, double vision, eye pain. EARS, NOSE, THROAT: Denies tinnitus, ear pain, hearing loss. RESPIRATORY: Denies cough, wheeze, shortness of breath.  CARDIOVASCULAR: Denies chest pain, palpitations, edema.  GASTROINTESTINAL: Denies nausea, vomiting, diarrhea, abdominal pain. Denies bright red blood per rectum. GENITOURINARY: Denies dysuria, hematuria. ENDOCRINE: Denies nocturia or thyroid problems. HEMATOLOGIC AND LYMPHATIC: Denies easy bruising or bleeding. SKIN: Denies rash or lesion. MUSCULOSKELETAL: Denies pain in neck, back, shoulder, knees, hips or arthritic symptoms.   NEUROLOGIC: Denies paralysis, paresthesias.  PSYCHIATRIC: Denies anxiety or depressive symptoms.   VITAL SIGNS:  Blood pressure 115/68, pulse 53, temperature 98.7 F (37.1 C), temperature source Oral, resp. rate 17, height 5' (1.524 m), weight 73.392 kg (161 lb 12.8 oz), SpO2 100 %.  I/O:   Intake/Output Summary (Last 24 hours) at 11/12/15 0952 Last data filed at 11/11/15 1700  Gross per 24 hour  Intake    240 ml  Output      0 ml  Net    240 ml    PHYSICAL EXAMINATION:  GENERAL:  69 y.o.-year-old patient lying in the bed with no acute distress.  EYES: Pupils equal, round, reactive to light and accommodation. No scleral icterus. Extraocular muscles intact.  HEENT: Head atraumatic, normocephalic. Oropharynx and nasopharynx clear.  NECK:  Supple, no jugular venous distention. No thyroid enlargement, no tenderness.  LUNGS: Normal breath sounds bilaterally, no wheezing, rales,rhonchi or crepitation. No use of accessory muscles of respiration.  CARDIOVASCULAR: S1, S2 normal. No murmurs, rubs, or gallops.  ABDOMEN: Soft, non-tender, non-distended. Bowel sounds present. No organomegaly or mass.  EXTREMITIES: No pedal edema, cyanosis, or clubbing.  NEUROLOGIC: Cranial nerves II through XII are intact. Muscle strength 5/5  in all extremities. Sensation intact. Gait not checked.  PSYCHIATRIC: The patient is alert and oriented x 3.  SKIN: No obvious rash, lesion, or ulcer.   DATA REVIEW:   CBC  Recent Labs Lab 11/12/15 0553  WBC 3.6  HGB 8.4*  HCT 25.4*  PLT 89*    Chemistries   Recent Labs Lab 11/08/15 2022  11/12/15 0553  NA 138  < > 145  K 4.1  < > 3.9  CL 111  < > 121*  CO2 20*  < > 22  GLUCOSE 109*  < > 83  BUN 21*  < > 9  CREATININE 3.81*  < > 1.26*  CALCIUM 8.7*  < > 8.1*  AST 79*  --   --   ALT 50  --   --   ALKPHOS 91  --   --   BILITOT 0.3  --   --   < > = values in this interval not displayed.  Cardiac Enzymes No results for input(s): TROPONINI in the  last 168 hours.  Microbiology Results  No results found for this or any previous visit.  RADIOLOGY:  Dg Esophagus  11/08/2015  CLINICAL DATA:  Loss of taste. EXAM: ESOPHOGRAM/BARIUM SWALLOW TECHNIQUE: Combined double contrast and single contrast examination performed using effervescent crystals, thick barium liquid, and thin barium liquid. FLUOROSCOPY TIME:  Radiation Exposure Index (as provided by the fluoroscopic device): 57.0 mGy COMPARISON:  No prior . FINDINGS: Prominent cricopharyngeus muscle. Cervical esophagus otherwise unremarkable. Mild narrowing of the distal esophagus. This esophageal small ulcer or diverticulum cannot be excluded. Endoscopic evaluation is suggested. No mass lesion. No reflux. Standardized barium tablet passed easily into the stomach. IMPRESSION: 1. Prominent cricopharyngeus muscle. 2. Mild distal esophageal narrowing. Small distal esophageal ulceration or diverticulum cannot be excluded. Endoscopic evaluation is suggested for further evaluation. No reflux. Standardized barium tablet passed easily into the stomach. Electronically Signed   By: Marcello Moores  Register   On: 11/08/2015 10:28    EKG:  No orders found for this or any previous visit.    Management plans discussed with the patient, family and they are in agreement.  CODE STATUS:     Code Status Orders        Start     Ordered   11/08/15 2254  Full code   Continuous     11/08/15 2253      TOTAL TIME TAKING CARE OF THIS PATIENT: 45 minutes.    @[facility] @  on 11/12/2015 at 9:52 AM  Between 7am to 6pm - Pager - [phone]  After 6pm go to [email or web address] - password [email or web address] [facility]  Office  [phone]  CC: Primary care physician; Baltazar Apo, MD

## 2015-11-13 LAB — PROTEIN ELECTROPHORESIS, SERUM
A/G RATIO SPE: 1.2 (ref 0.7–1.7)
ALBUMIN ELP: 2.9 g/dL (ref 2.9–4.4)
ALPHA-1-GLOBULIN: 0.2 g/dL (ref 0.0–0.4)
ALPHA-2-GLOBULIN: 0.4 g/dL (ref 0.4–1.0)
BETA GLOBULIN: 0.9 g/dL (ref 0.7–1.3)
GAMMA GLOBULIN: 0.9 g/dL (ref 0.4–1.8)
Globulin, Total: 2.4 g/dL (ref 2.2–3.9)
Total Protein ELP: 5.3 g/dL — ABNORMAL LOW (ref 6.0–8.5)

## 2015-11-14 LAB — MPO/PR-3 (ANCA) ANTIBODIES
ANCA Proteinase 3: <3.5 U/mL (ref 0.0–3.5)
Myeloperoxidase Abs: <9 U/mL (ref 0.0–9.0)

## 2015-11-14 LAB — GLOMERULAR BASEMENT MEMBRANE ANTIBODIES: GBM AB: 6 U (ref 0–20)

## 2015-11-14 LAB — KAPPA/LAMBDA LIGHT CHAINS
KAPPA FREE LGHT CHN: 48.42 mg/L — AB (ref 3.30–19.40)
Kappa, lambda light chain ratio: 1.35 (ref 0.26–1.65)
Lambda free light chains: 35.82 mg/L — ABNORMAL HIGH (ref 5.71–26.30)

## 2015-11-14 LAB — ANA W/REFLEX IF POSITIVE: ANA: NEGATIVE

## 2015-11-14 LAB — PARATHYROID HORMONE, INTACT (NO CA): PTH: 68 pg/mL — ABNORMAL HIGH (ref 15–65)

## 2015-11-14 LAB — C3 COMPLEMENT: C3 COMPLEMENT: 108 mg/dL (ref 82–167)

## 2015-11-14 LAB — ANTISTREPTOLYSIN O TITER: ASO: 24 IU/mL (ref 0.0–200.0)

## 2015-11-14 LAB — C4 COMPLEMENT: COMPLEMENT C4, BODY FLUID: 36 mg/dL (ref 14–44)

## 2015-11-20 ENCOUNTER — Ambulatory Visit
Admission: RE | Admit: 2015-11-20 | Discharge: 2015-11-20 | Disposition: A | Discharge status: Home / Self Care | Payer: Medicare Other | Source: Ambulatory Visit | Attending: Family Medicine | Admitting: Family Medicine

## 2015-11-20 DIAGNOSIS — R432 Parageusia: Secondary | ICD-10-CM | POA: Diagnosis not present

## 2015-11-20 MED ORDER — GADOBENATE DIMEGLUMINE 529 MG/ML IV SOLN
15.0000 mL | Freq: Once | INTRAVENOUS | Status: AC | PRN
  Administered 2015-11-20: 15 mL via INTRAVENOUS

## 2015-12-05 ENCOUNTER — Inpatient Hospital Stay: Payer: Medicare Other | Attending: Internal Medicine

## 2015-12-14 ENCOUNTER — Encounter: Payer: Self-pay | Admitting: Internal Medicine

## 2015-12-14 ENCOUNTER — Inpatient Hospital Stay: Payer: Medicare Other | Attending: Internal Medicine | Admitting: Internal Medicine

## 2015-12-14 ENCOUNTER — Inpatient Hospital Stay: Payer: Medicare Other

## 2015-12-14 VITALS — BP 105/73 | HR 82 | Temp 95.7°F | Resp 18 | Ht 60.0 in | Wt 165.5 lb

## 2015-12-14 DIAGNOSIS — E669 Obesity, unspecified: Secondary | ICD-10-CM | POA: Diagnosis not present

## 2015-12-14 DIAGNOSIS — D696 Thrombocytopenia, unspecified: Secondary | ICD-10-CM | POA: Diagnosis not present

## 2015-12-14 DIAGNOSIS — D7589 Other specified diseases of blood and blood-forming organs: Secondary | ICD-10-CM

## 2015-12-14 DIAGNOSIS — Z7982 Long term (current) use of aspirin: Secondary | ICD-10-CM | POA: Insufficient documentation

## 2015-12-14 DIAGNOSIS — S0280XA Fracture of other specified skull and facial bones, unspecified side, initial encounter for closed fracture: Secondary | ICD-10-CM | POA: Diagnosis not present

## 2015-12-14 DIAGNOSIS — N289 Disorder of kidney and ureter, unspecified: Secondary | ICD-10-CM | POA: Insufficient documentation

## 2015-12-14 DIAGNOSIS — R439 Unspecified disturbances of smell and taste: Secondary | ICD-10-CM | POA: Diagnosis not present

## 2015-12-14 DIAGNOSIS — D649 Anemia, unspecified: Secondary | ICD-10-CM | POA: Diagnosis not present

## 2015-12-14 DIAGNOSIS — Z79899 Other long term (current) drug therapy: Secondary | ICD-10-CM | POA: Insufficient documentation

## 2015-12-14 DIAGNOSIS — Z1239 Encounter for other screening for malignant neoplasm of breast: Secondary | ICD-10-CM

## 2015-12-14 DIAGNOSIS — I10 Essential (primary) hypertension: Secondary | ICD-10-CM | POA: Diagnosis not present

## 2015-12-14 DIAGNOSIS — R809 Proteinuria, unspecified: Secondary | ICD-10-CM

## 2015-12-14 LAB — CBC WITH DIFFERENTIAL/PLATELET
Basophils Absolute: 0 10*3/uL (ref 0–0.1)
Basophils Relative: 1 %
Eosinophils Absolute: 0 10*3/uL (ref 0–0.7)
Eosinophils Relative: 0 %
HCT: 31.9 % — ABNORMAL LOW (ref 35.0–47.0)
Hemoglobin: 10.5 g/dL — ABNORMAL LOW (ref 12.0–16.0)
Lymphocytes Relative: 23 %
Lymphs Abs: 1.1 10*3/uL (ref 1.0–3.6)
MCH: 33.9 pg (ref 26.0–34.0)
MCHC: 32.8 g/dL (ref 32.0–36.0)
MCV: 103.4 fL — ABNORMAL HIGH (ref 80.0–100.0)
Monocytes Absolute: 0.4 10*3/uL (ref 0.2–0.9)
Monocytes Relative: 9 %
Neutro Abs: 3.2 10*3/uL (ref 1.4–6.5)
Neutrophils Relative %: 67 %
Platelets: 138 10*3/uL — ABNORMAL LOW (ref 150–440)
RBC: 3.09 MIL/uL — ABNORMAL LOW (ref 3.80–5.20)
RDW: 16.5 % — ABNORMAL HIGH (ref 11.5–14.5)
WBC: 4.9 10*3/uL (ref 3.6–11.0)

## 2015-12-14 LAB — COMPREHENSIVE METABOLIC PANEL
ALBUMIN: 4.1 g/dL (ref 3.5–5.0)
ALT: 19 U/L (ref 14–54)
ANION GAP: 10 (ref 5–15)
AST: 39 U/L (ref 15–41)
Alkaline Phosphatase: 46 U/L (ref 38–126)
BUN: 27 mg/dL — ABNORMAL HIGH (ref 6–20)
CALCIUM: 9 mg/dL (ref 8.9–10.3)
CHLORIDE: 103 mmol/L (ref 101–111)
CO2: 23 mmol/L (ref 22–32)
CREATININE: 1.08 mg/dL — AB (ref 0.44–1.00)
GFR calc non Af Amer: 51 mL/min — ABNORMAL LOW (ref >60)
GFR, EST AFRICAN AMERICAN: 59 mL/min — AB (ref >60)
GLUCOSE: 109 mg/dL — AB (ref 65–99)
Potassium: 4.9 mmol/L (ref 3.5–5.1)
SODIUM: 136 mmol/L (ref 135–145)
Total Bilirubin: 0.6 mg/dL (ref 0.3–1.2)
Total Protein: 7.4 g/dL (ref 6.5–8.1)

## 2015-12-14 LAB — RETICULOCYTES
RBC.: 3.09 MIL/uL — AB (ref 3.80–5.20)
RETIC CT PCT: 1.1 % (ref 0.4–3.1)
Retic Count, Absolute: 34 10*3/uL (ref 19.0–183.0)

## 2015-12-14 NOTE — Progress Notes (Signed)
[place] @ [facility] [phone]  Fax:([phone] [place]: May 30, 1946  MR#: [id number]  TGG#:[id number]  Patient Care Team: Denton Lank, MD as PCP - General (Family Medicine)  CHIEF COMPLAINT:  Chief Complaint  Patient presents with  . New Patient (Initial Visit)   Anemia  No history exists.    Oncology Flowsheet 07/26/2015  ondansetron (ZOFRAN-ODT) PO 4 mg    HISTORY OF PRESENT ILLNESS:   Mrs. Claw is referred to our clinic for evaluation of anemia. She does not know her baseline hemoglobin, but it appears that in late fall 2016 she developed loss of taste, followed by decreased oral intake, followed by renal failure, requiring admission and aggressive fluid resuscitation. At that time she was found to have macrocytic anemia with hemoglobin at approximately 8.5 g/dL and MCV of above 100. Creatinine peaked at around 4 mg/dL, but has quickly improved to essentially normal levels at the end of December. With that, her hemoglobin also improved to 9.8 g/dL, and platelet count, which was initially low around 60,000 cells per microliter, also improved to 190,000 cells per microliter. During the hospital stay and a workup was performed, which showed normal vitamin B12, folate levels, somewhat elevated ferritin, normal serum protein electrophoresis, normal serum free light chain ratio, urine protein electrophoresis with 15% M spike, but no urine protein and immunofixation done.   Since the discharge Mrs. Pettaway has done very well clinically. She is back to her normal level of activity, she is able to perform all activities of daily living without difficulties, although she and kidneys to have decreased appetite and keeps complaining about loss of taste.  She continues to drink "a couple of beers a day" , which she has done " forever ". She denies any history of sickle cell disease or thalassemia in he family.    REVIEW OF SYSTEMS:   Review of Systems  All  other systems reviewed and are negative.    PAST MEDICAL HISTORY: Past Medical History  Diagnosis Date  . Hypertension     PAST SURGICAL HISTORY: No past surgical history on file.  FAMILY HISTORY Family History  Problem Relation Age of Onset  . Hypertension Other     ADVANCED DIRECTIVES:  No flowsheet data found.  HEALTH MAINTENANCE: Social History  Substance Use Topics  . Smoking status: Never Smoker   . Smokeless tobacco: Not on file  . Alcohol Use: 8.4 oz/week    14 Cans of beer per week     Comment: everyday     Allergies  Allergen Reactions  . Sulfa Antibiotics Anaphylaxis    Current Outpatient Prescriptions  Medication Sig Dispense Refill  . acetaminophen (TYLENOL) 325 MG tablet Take 2 tablets (650 mg total) by mouth every 6 (six) hours as needed for mild pain (or Fever >/= 101).    Marland Kitchen aspirin EC 81 MG tablet Take 81 mg by mouth daily.    Marland Kitchen atenolol (TENORMIN) 25 MG tablet Take 25 mg by mouth daily.     . ondansetron (ZOFRAN) 4 MG tablet Take 1 tablet (4 mg total) by mouth daily as needed for nausea or vomiting. 20 tablet 1  . pravastatin (PRAVACHOL) 80 MG tablet Take 1 tablet by mouth daily.    . ranitidine (ZANTAC) 150 MG tablet Take 1 tablet (150 mg total) by mouth at bedtime. (Patient taking differently: Take 150 mg by mouth at bedtime as needed. ) 30 tablet 1   No current facility-administered medications for  this visit.    OBJECTIVE:  Filed Vitals:   12/14/15 0934  BP: 105/73  Pulse: 82  Temp: 95.7 F (35.4 C)  Resp: 18     Body mass index is 32.31 kg/(m^2).    ECOG FS:0 - Asymptomatic  Physical Exam  Constitutional: She is oriented to person, place, and time and well-developed, well-nourished, and in no distress. No distress.  Obese, younger than stated age 70 female  HENT:  Head: Normocephalic and atraumatic.  Right Ear: External ear normal.  Left Ear: External ear normal.  Mouth/Throat: Oropharynx is clear and moist.    Eyes: Conjunctivae are normal. Pupils are equal, round, and reactive to light. Right eye exhibits no discharge. Left eye exhibits no discharge. No scleral icterus.  Neck: Normal range of motion. Neck supple. No JVD present. No tracheal deviation present. No thyromegaly present.  Cardiovascular: Normal rate, regular rhythm, normal heart sounds and intact distal pulses.  Exam reveals no gallop and no friction rub.   No murmur heard. Pulmonary/Chest: Effort normal and breath sounds normal. No stridor. No respiratory distress. She has no wheezes. She has no rales. She exhibits no tenderness.  Abdominal: Soft. Bowel sounds are normal. She exhibits no distension and no mass. There is no tenderness. There is no rebound and no guarding.  Genitourinary:  Postponed  Musculoskeletal: Normal range of motion. She exhibits no edema or tenderness.  Lymphadenopathy:    She has no cervical adenopathy.  Neurological: She is alert and oriented to person, place, and time. She has normal reflexes. No cranial nerve deficit. She exhibits normal muscle tone. Gait normal. Coordination normal. GCS score is 15.  Skin: Skin is warm. No rash noted. She is not diaphoretic. No erythema. No pallor.  Psychiatric: Mood, memory, affect and judgment normal.  Nursing note and vitals reviewed.    LAB RESULTS:  Recent Results (from the past 2160 hour(s))  CBC     Status: Abnormal   Collection Time: 11/08/15  6:08 PM  Result Value Ref Range   WBC 3.8 3.6 - 11.0 K/uL   RBC 3.13 (L) 3.80 - 5.20 MIL/uL   Hemoglobin 10.7 (L) 12.0 - 16.0 g/dL   HCT 33.3 (L) 35.0 - 47.0 %   MCV 106.6 (H) 80.0 - 100.0 fL   MCH 34.2 (H) 26.0 - 34.0 pg   MCHC 32.1 32.0 - 36.0 g/dL   RDW 15.8 (H) 11.5 - 14.5 %   Platelets 140 (L) 150 - 440 K/uL  Comprehensive metabolic panel     Status: Abnormal   Collection Time: 11/08/15  8:22 PM  Result Value Ref Range   Sodium 138 135 - 145 mmol/L   Potassium 4.1 3.5 - 5.1 mmol/L   Chloride 111 101 - 111  mmol/L   CO2 20 (L) 22 - 32 mmol/L   Glucose, Bld 109 (H) 65 - 99 mg/dL   BUN 21 (H) 6 - 20 mg/dL   Creatinine, Ser 3.81 (H) 0.44 - 1.00 mg/dL   Calcium 8.7 (L) 8.9 - 10.3 mg/dL   Total Protein 6.5 6.5 - 8.1 g/dL   Albumin 3.1 (L) 3.5 - 5.0 g/dL   AST 79 (H) 15 - 41 U/L   ALT 50 14 - 54 U/L   Alkaline Phosphatase 91 38 - 126 U/L   Total Bilirubin 0.3 0.3 - 1.2 mg/dL   GFR calc non Af Amer 11 (L) >60 mL/min   GFR calc Af Amer 13 (L) >60 mL/min    Comment: (NOTE) The  eGFR has been calculated using the CKD EPI equation. This calculation has not been validated in all clinical situations. eGFR's persistently <60 mL/min signify possible Chronic Kidney Disease.    Anion gap 7 5 - 15  Urinalysis complete, with microscopic     Status: Abnormal   Collection Time: 11/08/15 10:21 PM  Result Value Ref Range   Color, Urine YELLOW (A) YELLOW   APPearance HAZY (A) CLEAR   Glucose, UA NEGATIVE NEGATIVE mg/dL   Bilirubin Urine NEGATIVE NEGATIVE   Ketones, ur NEGATIVE NEGATIVE mg/dL   Specific Gravity, Urine 1.006 1.005 - 1.030   Hgb urine dipstick 1+ (A) NEGATIVE   pH 6.0 5.0 - 8.0   Protein, ur 30 (A) NEGATIVE mg/dL   Nitrite NEGATIVE NEGATIVE   Leukocytes, UA 2+ (A) NEGATIVE   RBC / HPF 0-5 0 - 5 RBC/hpf   WBC, UA 6-30 0 - 5 WBC/hpf   Bacteria, UA MANY (A) NONE SEEN   Squamous Epithelial / LPF 0-5 (A) NONE SEEN  Sodium, urine, random     Status: None   Collection Time: 11/08/15 10:21 PM  Result Value Ref Range   Sodium, Ur 11 mmol/L  Creatinine, urine, random     Status: None   Collection Time: 11/08/15 10:21 PM  Result Value Ref Range   Creatinine, Urine 84 mg/dL  Eosinophil, Urine     Status: None   Collection Time: 11/08/15 10:21 PM  Result Value Ref Range   Eosinophil, Urine No Eosinophils Seen %    Comment: (NOTE)                                  <5% few or none seen Performed At: [facility] [place], [place] [id number] Lindon Romp MD  [id number]   Basic metabolic panel     Status: Abnormal   Collection Time: 11/09/15  5:48 AM  Result Value Ref Range   Sodium 141 135 - 145 mmol/L   Potassium 4.4 3.5 - 5.1 mmol/L    Comment: HEMOLYSIS AT THIS LEVEL MAY AFFECT RESULT   Chloride 116 (H) 101 - 111 mmol/L   CO2 21 (L) 22 - 32 mmol/L   Glucose, Bld 92 65 - 99 mg/dL   BUN 17 6 - 20 mg/dL   Creatinine, Ser 3.52 (H) 0.44 - 1.00 mg/dL   Calcium 8.5 (L) 8.9 - 10.3 mg/dL   GFR calc non Af Amer 12 (L) >60 mL/min   GFR calc Af Amer 14 (L) >60 mL/min    Comment: (NOTE) The eGFR has been calculated using the CKD EPI equation. This calculation has not been validated in all clinical situations. eGFR's persistently <60 mL/min signify possible Chronic Kidney Disease.    Anion gap 4 (L) 5 - 15  CBC     Status: Abnormal   Collection Time: 11/09/15  5:48 AM  Result Value Ref Range   WBC 3.1 (L) 3.6 - 11.0 K/uL   RBC 2.87 (L) 3.80 - 5.20 MIL/uL   Hemoglobin 10.0 (L) 12.0 - 16.0 g/dL   HCT 30.7 (L) 35.0 - 47.0 %   MCV 106.9 (H) 80.0 - 100.0 fL   MCH 34.7 (H) 26.0 - 34.0 pg   MCHC 32.5 32.0 - 36.0 g/dL   RDW 15.3 (H) 11.5 - 14.5 %   Platelets 98 (L) 150 - 440 K/uL  CBC     Status: Abnormal  Collection Time: 11/10/15  4:30 AM  Result Value Ref Range   WBC 3.7 3.6 - 11.0 K/uL   RBC 2.47 (L) 3.80 - 5.20 MIL/uL   Hemoglobin 8.4 (L) 12.0 - 16.0 g/dL   HCT 26.1 (L) 35.0 - 47.0 %   MCV 105.7 (H) 80.0 - 100.0 fL   MCH 34.1 (H) 26.0 - 34.0 pg   MCHC 32.3 32.0 - 36.0 g/dL   RDW 15.6 (H) 11.5 - 14.5 %   Platelets 66 (L) 150 - 440 K/uL  ANA w/Reflex if Positive     Status: None   Collection Time: 11/10/15  4:04 PM  Result Value Ref Range   Anit Nuclear Antibody(ANA) Negative Negative    Comment: (NOTE) Performed At: [facility] [place], [place] [id number] Lindon Romp MD [id number]:[id number]   Mpo/pr-3 (anca) antibodies     Status: None   Collection Time: 11/10/15  4:04 PM  Result Value Ref Range    Myeloperoxidase Abs <9.0 0.0 - 9.0 U/mL   ANCA Proteinase 3 <3.5 0.0 - 3.5 U/mL    Comment: (NOTE) Performed At: [facility] [place], [place] [id number] Lindon Romp MD [id number]   Parathyroid hormone, intact (no Ca)     Status: Abnormal   Collection Time: 11/10/15  4:04 PM  Result Value Ref Range   PTH 68 (H) 15 - 65 pg/mL    Comment: (NOTE) Performed At: [facility] [place], [place] [id number] Lindon Romp MD [id number]   Phosphorus     Status: Abnormal   Collection Time: 11/10/15  4:04 PM  Result Value Ref Range   Phosphorus 2.2 (L) 2.5 - 4.6 mg/dL  Protein electrophoresis, serum     Status: Abnormal   Collection Time: 11/10/15  4:04 PM  Result Value Ref Range   Total Protein ELP 5.3 (L) 6.0 - 8.5 g/dL   Albumin ELP 2.9 2.9 - 4.4 g/dL   Alpha-1-Globulin 0.2 0.0 - 0.4 g/dL   Alpha-2-Globulin 0.4 0.4 - 1.0 g/dL   Beta Globulin 0.9 0.7 - 1.3 g/dL   Gamma Globulin 0.9 0.4 - 1.8 g/dL   M-Spike, % Not Observed Not Observed g/dL   SPE Interp. Comment     Comment: (NOTE) The SPE pattern appears essentially unremarkable. Evidence of monoclonal protein is not apparent. Performed At: [facility] [place], [place] [id number] Lindon Romp MD [id number]    Comment Comment     Comment: (NOTE) Protein electrophoresis scan will follow via computer, mail, or courier delivery.    GLOBULIN, TOTAL 2.4 2.2 - 3.9 g/dL   A/G Ratio 1.2 0.7 - 1.7  Kappa/lambda light chains     Status: Abnormal   Collection Time: 11/10/15  4:04 PM  Result Value Ref Range   Kappa free light chain 48.42 (H) 3.30 - 19.40 mg/L   Lamda free light chains 35.82 (H) 5.71 - 26.30 mg/L   Kappa, lamda light chain ratio 1.35 0.26 - 1.65    Comment: (NOTE) Performed At: [facility] [place], [place] [id number] Lindon Romp MD [id number]   C3 complement     Status: None   Collection Time:  11/10/15  4:04 PM  Result Value Ref Range   C3 Complement 108 82 - 167 mg/dL    Comment: (NOTE) Performed At: [facility] [address] [place], [place] [id number] Lindon Romp MD [id number]   C4 complement  Status: None   Collection Time: 11/10/15  4:04 PM  Result Value Ref Range   Complement C4, Body Fluid 36 14 - 44 mg/dL    Comment: (NOTE) Performed At: [facility] [place], [place] [id number] Lindon Romp MD [id number]   Glomerular basement membrane antibodies     Status: None   Collection Time: 11/10/15  4:04 PM  Result Value Ref Range   GBM Ab 6 0 - 20 units    Comment: (NOTE)                   Negative                   0 - 20                   Weak Positive             21 - 30                   Moderate to Strong Positive   >30 Performed At: [facility] [place], [place] [id number] Lindon Romp MD [id number]   Antistreptolysin O titer     Status: None   Collection Time: 11/10/15  4:04 PM  Result Value Ref Range   ASO 24.0 0.0 - 200.0 IU/mL    Comment: (NOTE) Performed At: [facility] [place], [place] [id number] Lindon Romp MD [id number]   Iron and TIBC     Status: Abnormal   Collection Time: 11/10/15  4:04 PM  Result Value Ref Range   Iron 118 28 - 170 ug/dL   TIBC 159 (L) 250 - 450 ug/dL   Saturation Ratios 74 (H) 10.4 - 31.8 %   UIBC 41 ug/dL  Ferritin     Status: Abnormal   Collection Time: 11/10/15  4:04 PM  Result Value Ref Range   Ferritin 784 (H) 11 - 307 ng/mL  Protein Electro, Random Urine     Status: Abnormal   Collection Time: 11/10/15  6:25 PM  Result Value Ref Range   Total Protein, Urine 51.5 Not Estab. mg/dL   Albumin ELP, Urine 17.7 %   Alpha-1-Globulin, U 5.3 %   Alpha-2-Globulin, U 28.9 %   Beta Globulin, U 26.2 %   Gamma Globulin, U 21.9 %   M Component, Ur 15.9 (H) Not Observed %    Comment: An additional m spike was  observed at a concentration of 7.1 %    PLEASE NOTE: Comment     Comment: (NOTE) Protein electrophoresis scan will follow via computer, mail, or courier delivery. Performed At: [facility] [place], [place] [id number] Lindon Romp MD [id number]   Basic metabolic panel     Status: Abnormal   Collection Time: 11/11/15  4:16 AM  Result Value Ref Range   Sodium 142 135 - 145 mmol/L   Potassium 4.6 3.5 - 5.1 mmol/L    Comment: HEMOLYSIS AT THIS LEVEL MAY AFFECT RESULT   Chloride 121 (H) 101 - 111 mmol/L   CO2 19 (L) 22 - 32 mmol/L   Glucose, Bld 86 65 - 99 mg/dL   BUN 10 6 - 20 mg/dL   Creatinine, Ser 1.57 (H) 0.44 - 1.00 mg/dL   Calcium 7.8 (L) 8.9 - 10.3 mg/dL   GFR calc non Af Amer 32 (L) >60 mL/min   GFR calc Af Amer 37 (L) >60 mL/min  Comment: (NOTE) The eGFR has been calculated using the CKD EPI equation. This calculation has not been validated in all clinical situations. eGFR's persistently <60 mL/min signify possible Chronic Kidney Disease.    Anion gap 2 (L) 5 - 15  CBC     Status: Abnormal   Collection Time: 11/11/15  4:16 AM  Result Value Ref Range   WBC 3.1 (L) 3.6 - 11.0 K/uL   RBC 2.43 (L) 3.80 - 5.20 MIL/uL   Hemoglobin 8.3 (L) 12.0 - 16.0 g/dL   HCT 26.0 (L) 35.0 - 47.0 %   MCV 107.0 (H) 80.0 - 100.0 fL   MCH 34.2 (H) 26.0 - 34.0 pg   MCHC 32.0 32.0 - 36.0 g/dL   RDW 15.3 (H) 11.5 - 14.5 %   Platelets 77 (L) 150 - 440 K/uL  CBC     Status: Abnormal   Collection Time: 11/12/15  5:53 AM  Result Value Ref Range   WBC 3.6 3.6 - 11.0 K/uL   RBC 2.39 (L) 3.80 - 5.20 MIL/uL   Hemoglobin 8.4 (L) 12.0 - 16.0 g/dL   HCT 25.4 (L) 35.0 - 47.0 %   MCV 106.6 (H) 80.0 - 100.0 fL   MCH 35.2 (H) 26.0 - 34.0 pg   MCHC 33.0 32.0 - 36.0 g/dL   RDW 15.3 (H) 11.5 - 14.5 %   Platelets 89 (L) 150 - 440 K/uL  Basic metabolic panel     Status: Abnormal   Collection Time: 11/12/15  5:53 AM  Result Value Ref Range   Sodium 145 135 - 145 mmol/L     Potassium 3.9 3.5 - 5.1 mmol/L   Chloride 121 (H) 101 - 111 mmol/L   CO2 22 22 - 32 mmol/L   Glucose, Bld 83 65 - 99 mg/dL   BUN 9 6 - 20 mg/dL   Creatinine, Ser 1.26 (H) 0.44 - 1.00 mg/dL   Calcium 8.1 (L) 8.9 - 10.3 mg/dL   GFR calc non Af Amer 42 (L) >60 mL/min   GFR calc Af Amer 49 (L) >60 mL/min    Comment: (NOTE) The eGFR has been calculated using the CKD EPI equation. This calculation has not been validated in all clinical situations. eGFR's persistently <60 mL/min signify possible Chronic Kidney Disease.    Anion gap 2 (L) 5 - 15  CBC with Differential/Platelet     Status: Abnormal   Collection Time: 12/14/15 10:37 AM  Result Value Ref Range   WBC 4.9 3.6 - 11.0 K/uL   RBC 3.09 (L) 3.80 - 5.20 MIL/uL   Hemoglobin 10.5 (L) 12.0 - 16.0 g/dL   HCT 31.9 (L) 35.0 - 47.0 %   MCV 103.4 (H) 80.0 - 100.0 fL   MCH 33.9 26.0 - 34.0 pg   MCHC 32.8 32.0 - 36.0 g/dL   RDW 16.5 (H) 11.5 - 14.5 %   Platelets 138 (L) 150 - 440 K/uL   Neutrophils Relative % 67 %   Neutro Abs 3.2 1.4 - 6.5 K/uL   Lymphocytes Relative 23 %   Lymphs Abs 1.1 1.0 - 3.6 K/uL   Monocytes Relative 9 %   Monocytes Absolute 0.4 0.2 - 0.9 K/uL   Eosinophils Relative 0 %   Eosinophils Absolute 0.0 0 - 0.7 K/uL   Basophils Relative 1 %   Basophils Absolute 0.0 0 - 0.1 K/uL  Reticulocytes     Status: Abnormal   Collection Time: 12/14/15 10:37 AM  Result Value Ref Range   Retic  Ct Pct 1.1 0.4 - 3.1 %   RBC. 3.09 (L) 3.80 - 5.20 MIL/uL   Retic Count, Manual 34.0 19.0 - 183.0 K/uL  Comprehensive metabolic panel     Status: Abnormal   Collection Time: 12/14/15 10:37 AM  Result Value Ref Range   Sodium 136 135 - 145 mmol/L   Potassium 4.9 3.5 - 5.1 mmol/L   Chloride 103 101 - 111 mmol/L   CO2 23 22 - 32 mmol/L   Glucose, Bld 109 (H) 65 - 99 mg/dL   BUN 27 (H) 6 - 20 mg/dL   Creatinine, Ser 1.08 (H) 0.44 - 1.00 mg/dL   Calcium 9.0 8.9 - 10.3 mg/dL   Total Protein 7.4 6.5 - 8.1 g/dL   Albumin 4.1 3.5 -  5.0 g/dL   AST 39 15 - 41 U/L   ALT 19 14 - 54 U/L   Alkaline Phosphatase 46 38 - 126 U/L   Total Bilirubin 0.6 0.3 - 1.2 mg/dL   GFR calc non Af Amer 51 (L) >60 mL/min   GFR calc Af Amer 59 (L) >60 mL/min    Comment: (NOTE) The eGFR has been calculated using the CKD EPI equation. This calculation has not been validated in all clinical situations. eGFR's persistently <60 mL/min signify possible Chronic Kidney Disease.    Anion gap 10 5 - 15    STUDIES: Mr Kizzie Fantasia Contrast  11/23/2015  CLINICAL DATA:  70 year old female with sudden onset loss of taste for 6 months. No known injury. Initial encounter. EXAM: MRI HEAD WITHOUT AND WITH CONTRAST TECHNIQUE: Multiplanar, multiecho pulse sequences of the brain and surrounding structures were obtained without and with intravenous contrast. CONTRAST:  87m MULTIHANCE GADOBENATE DIMEGLUMINE 529 MG/ML IV SOLN COMPARISON:  None. FINDINGS: Cerebral volume is within normal limits for age. No restricted diffusion to suggest acute infarction. No midline shift, mass effect, evidence of mass lesion, ventriculomegaly, extra-axial collection or acute intracranial hemorrhage. Cervicomedullary junction and pituitary are within normal limits. Major intracranial vascular flow voids are within normal limits. GPearline Cablesand white matter signal is within normal limits for age throughout the brain. No abnormal enhancement identified. No dural thickening. Grossly normal visualized internal auditory structures. Mastoids are clear. Normal stylomastoid foramina. Normal pterygopalatine fossa. No skullbase abnormality identified. Oral cavity appears within normal limits. Olfactory bulbs appear normal on series 11, image 6. Olfactory recesses appear normal. There is a chronic right lamina papyracea fracture. Otherwise unremarkable paranasal sinuses and nasal cavity. Orbit and scalp soft tissues are within normal limits. Negative for age visualized cervical spine. Visualized bone marrow  signal is within normal limits. IMPRESSION: Normal for age MRI appearance of the brain. No explanation for loss of taste identified. Electronically Signed   By: HGenevie AnnM.D.   On: 1Dec 16, 201611:27    ASSESSMENT and MEDICAL DECISION MAKING:  #Anemia-likely multifactorial. We do not have a baseline hemoglobin, so the exact assessment is not possible at this time, but there is likely a component of renal insufficiency, which is disappearing with improving renal function, alcoholic steatohepatitis in a person with a long-standing history of alcohol intake (this should also explain macrocytosis), and, possibly, myelodysplastic syndrome (the contribution of this component is hard to ascertain without baseline hemoglobin). An extensive workup showed normal vitamin B12 and folate levels, absence of iron deficiency, and absence of plasma cell dyscrasia. We should not pursue a bone marrow biopsy at this point, since there has been an improvement in CBC over the past months, as  such, it is reasonable to wait for another 2 months prior to pursuing any additional workup, such as bone marrow biopsy.  #Thrombocytopenia-a combination of severe illness (improving), and chronic alcohol intake. We will recheck platelet count in 2 months.  #Renal insufficiency-clearly not a result of plasma cell dyscrasia. Discussed the necessity to abstain from old nonsteroidal anti-inflammatory medications.  #Age appropriate screening-colonoscopy is scheduled for next week. We will request screening mammogram.  Patient will return to our clinic in 2 months. At that point we will recheck CBC, and if abnormalities persist, will pursue additional workup or continue with observation.  Patient expressed understanding and was in agreement with this plan. She also understands that She can call clinic at any time with any questions, concerns, or complaints.    No matching staging information was found for the patient.  Roxana Hires,  MD   12/14/2015 9:39 AM      [email or web address]

## 2015-12-18 LAB — MISC LABCORP TEST (SEND OUT): LABCORP TEST CODE: 3715

## 2015-12-21 ENCOUNTER — Encounter: Payer: Self-pay | Admitting: *Deleted

## 2015-12-24 ENCOUNTER — Encounter: Admission: RE | Payer: Self-pay | Source: Ambulatory Visit

## 2015-12-24 ENCOUNTER — Ambulatory Visit: Admission: RE | Admit: 2015-12-24 | Payer: Medicare Other | Source: Ambulatory Visit | Admitting: Gastroenterology

## 2015-12-24 HISTORY — DX: Hyperlipidemia, unspecified: E78.5

## 2015-12-24 SURGERY — COLONOSCOPY WITH PROPOFOL
Anesthesia: General

## 2016-01-02 ENCOUNTER — Ambulatory Visit: Payer: Medicare Other | Attending: Internal Medicine

## 2016-02-15 ENCOUNTER — Other Ambulatory Visit: Payer: Self-pay | Admitting: Hematology and Oncology

## 2016-02-15 ENCOUNTER — Inpatient Hospital Stay: Payer: Medicare Other

## 2016-02-15 ENCOUNTER — Inpatient Hospital Stay: Payer: Medicare Other | Admitting: Hematology and Oncology

## 2016-02-15 DIAGNOSIS — D649 Anemia, unspecified: Secondary | ICD-10-CM

## 2016-03-02 ENCOUNTER — Other Ambulatory Visit: Payer: Self-pay | Admitting: *Deleted

## 2016-03-02 DIAGNOSIS — D649 Anemia, unspecified: Secondary | ICD-10-CM

## 2016-03-03 ENCOUNTER — Inpatient Hospital Stay: Payer: Medicare Other | Admitting: Hematology and Oncology

## 2016-03-03 ENCOUNTER — Inpatient Hospital Stay: Payer: Medicare Other

## 2016-03-11 ENCOUNTER — Inpatient Hospital Stay: Payer: Medicare Other

## 2016-03-11 ENCOUNTER — Inpatient Hospital Stay: Payer: Medicare Other | Admitting: Hematology and Oncology

## 2016-03-23 ENCOUNTER — Other Ambulatory Visit: Payer: Self-pay | Admitting: Hematology and Oncology

## 2016-03-24 ENCOUNTER — Inpatient Hospital Stay: Payer: Medicare Other

## 2016-03-24 ENCOUNTER — Inpatient Hospital Stay: Payer: Medicare Other | Admitting: Hematology and Oncology

## 2016-04-15 ENCOUNTER — Telehealth: Payer: Self-pay

## 2016-04-15 NOTE — Telephone Encounter (Signed)
3rd no show letter mailed

## 2017-10-20 ENCOUNTER — Observation Stay
Admission: EM | Admit: 2017-10-20 | Discharge: 2017-10-22 | Disposition: A | Discharge status: Home / Self Care | Payer: Medicare Other | Attending: Internal Medicine | Admitting: Internal Medicine

## 2017-10-20 ENCOUNTER — Encounter: Payer: Self-pay | Admitting: Emergency Medicine

## 2017-10-20 DIAGNOSIS — Z7982 Long term (current) use of aspirin: Secondary | ICD-10-CM | POA: Diagnosis not present

## 2017-10-20 DIAGNOSIS — Q438 Other specified congenital malformations of intestine: Secondary | ICD-10-CM | POA: Insufficient documentation

## 2017-10-20 DIAGNOSIS — D62 Acute posthemorrhagic anemia: Secondary | ICD-10-CM | POA: Diagnosis not present

## 2017-10-20 DIAGNOSIS — D128 Benign neoplasm of rectum: Principal | ICD-10-CM | POA: Insufficient documentation

## 2017-10-20 DIAGNOSIS — K228 Other specified diseases of esophagus: Secondary | ICD-10-CM | POA: Diagnosis not present

## 2017-10-20 DIAGNOSIS — E785 Hyperlipidemia, unspecified: Secondary | ICD-10-CM | POA: Diagnosis not present

## 2017-10-20 DIAGNOSIS — K2960 Other gastritis without bleeding: Secondary | ICD-10-CM | POA: Insufficient documentation

## 2017-10-20 DIAGNOSIS — K922 Gastrointestinal hemorrhage, unspecified: Secondary | ICD-10-CM | POA: Diagnosis present

## 2017-10-20 DIAGNOSIS — I10 Essential (primary) hypertension: Secondary | ICD-10-CM | POA: Diagnosis not present

## 2017-10-20 DIAGNOSIS — E871 Hypo-osmolality and hyponatremia: Secondary | ICD-10-CM | POA: Insufficient documentation

## 2017-10-20 DIAGNOSIS — D638 Anemia in other chronic diseases classified elsewhere: Secondary | ICD-10-CM | POA: Insufficient documentation

## 2017-10-20 DIAGNOSIS — Z882 Allergy status to sulfonamides status: Secondary | ICD-10-CM | POA: Insufficient documentation

## 2017-10-20 DIAGNOSIS — Z79899 Other long term (current) drug therapy: Secondary | ICD-10-CM | POA: Diagnosis not present

## 2017-10-20 DIAGNOSIS — R578 Other shock: Secondary | ICD-10-CM

## 2017-10-20 LAB — CBC
HEMATOCRIT: 35.4 % (ref 35.0–47.0)
HEMOGLOBIN: 11.5 g/dL — AB (ref 12.0–16.0)
MCH: 31.7 pg (ref 26.0–34.0)
MCHC: 32.5 g/dL (ref 32.0–36.0)
MCV: 97.6 fL (ref 80.0–100.0)
Platelets: 182 10*3/uL (ref 150–440)
RBC: 3.63 MIL/uL — AB (ref 3.80–5.20)
RDW: 14.5 % (ref 11.5–14.5)
WBC: 4.7 10*3/uL (ref 3.6–11.0)

## 2017-10-20 NOTE — ED Notes (Signed)
Pt reports 6 solid BMs today, dark red, denies pain or n/v/d.  Reports taking 800mg  IBU q5h for two days d/t leg pain.  Denies hx of ulcers, diverticulitis, or other abd issues.

## 2017-10-20 NOTE — ED Triage Notes (Signed)
Pt reports she has been having dark bloody stools all day today reports she has been taking Ibuprofen 800mg  for pain, reports she took last dose this morning and has had 6 BM and reports stool is dark red. Pt denies any other symptom at present. Pt talks in complete sentences no distress noted

## 2017-10-21 ENCOUNTER — Encounter: Payer: Self-pay | Admitting: Internal Medicine

## 2017-10-21 ENCOUNTER — Other Ambulatory Visit: Payer: Self-pay

## 2017-10-21 ENCOUNTER — Encounter
Admission: EM | Disposition: A | Discharge status: Home / Self Care | Payer: Self-pay | Source: Home / Self Care | Attending: Emergency Medicine

## 2017-10-21 DIAGNOSIS — D128 Benign neoplasm of rectum: Secondary | ICD-10-CM | POA: Diagnosis not present

## 2017-10-21 DIAGNOSIS — K2970 Gastritis, unspecified, without bleeding: Secondary | ICD-10-CM | POA: Diagnosis not present

## 2017-10-21 DIAGNOSIS — K922 Gastrointestinal hemorrhage, unspecified: Secondary | ICD-10-CM | POA: Diagnosis present

## 2017-10-21 DIAGNOSIS — K625 Hemorrhage of anus and rectum: Secondary | ICD-10-CM | POA: Diagnosis not present

## 2017-10-21 DIAGNOSIS — K5730 Diverticulosis of large intestine without perforation or abscess without bleeding: Secondary | ICD-10-CM | POA: Diagnosis not present

## 2017-10-21 DIAGNOSIS — K921 Melena: Secondary | ICD-10-CM | POA: Diagnosis not present

## 2017-10-21 DIAGNOSIS — K629 Disease of anus and rectum, unspecified: Secondary | ICD-10-CM | POA: Diagnosis not present

## 2017-10-21 LAB — BASIC METABOLIC PANEL
ANION GAP: 6 (ref 5–15)
BUN: 17 mg/dL (ref 6–20)
CHLORIDE: 104 mmol/L (ref 101–111)
CO2: 25 mmol/L (ref 22–32)
Calcium: 8.6 mg/dL — ABNORMAL LOW (ref 8.9–10.3)
Creatinine, Ser: 0.61 mg/dL (ref 0.44–1.00)
GFR calc non Af Amer: >60 mL/min (ref >60)
Glucose, Bld: 107 mg/dL — ABNORMAL HIGH (ref 65–99)
POTASSIUM: 4.8 mmol/L (ref 3.5–5.1)
Sodium: 135 mmol/L (ref 135–145)

## 2017-10-21 LAB — FOLATE: FOLATE: 6.2 ng/mL (ref >5.9)

## 2017-10-21 LAB — COMPREHENSIVE METABOLIC PANEL
ALBUMIN: 3.6 g/dL (ref 3.5–5.0)
ALK PHOS: 49 U/L (ref 38–126)
ALT: 16 U/L (ref 14–54)
ANION GAP: 11 (ref 5–15)
AST: 26 U/L (ref 15–41)
BUN: 18 mg/dL (ref 6–20)
CO2: 24 mmol/L (ref 22–32)
Calcium: 9.6 mg/dL (ref 8.9–10.3)
Chloride: 99 mmol/L — ABNORMAL LOW (ref 101–111)
Creatinine, Ser: 0.7 mg/dL (ref 0.44–1.00)
GFR calc Af Amer: >60 mL/min (ref >60)
GFR calc non Af Amer: >60 mL/min (ref >60)
GLUCOSE: 128 mg/dL — AB (ref 65–99)
POTASSIUM: 4.3 mmol/L (ref 3.5–5.1)
SODIUM: 134 mmol/L — AB (ref 135–145)
Total Bilirubin: 0.6 mg/dL (ref 0.3–1.2)
Total Protein: 6.7 g/dL (ref 6.5–8.1)

## 2017-10-21 LAB — FERRITIN: Ferritin: 49 ng/mL (ref 11–307)

## 2017-10-21 LAB — IRON AND TIBC
IRON: 52 ug/dL (ref 28–170)
SATURATION RATIOS: 15 % (ref 10.4–31.8)
TIBC: 354 ug/dL (ref 250–450)
UIBC: 302 ug/dL

## 2017-10-21 LAB — HEMOGLOBIN AND HEMATOCRIT, BLOOD
HCT: 33 % — ABNORMAL LOW (ref 35.0–47.0)
HCT: 32.1 % — ABNORMAL LOW (ref 35.0–47.0)
HEMATOCRIT: 34.4 % — AB (ref 35.0–47.0)
HEMATOCRIT: 31.1 % — AB (ref 35.0–47.0)
HEMOGLOBIN: 9.9 g/dL — AB (ref 12.0–16.0)
Hemoglobin: 10.7 g/dL — ABNORMAL LOW (ref 12.0–16.0)
Hemoglobin: 10.3 g/dL — ABNORMAL LOW (ref 12.0–16.0)
Hemoglobin: 10.9 g/dL — ABNORMAL LOW (ref 12.0–16.0)

## 2017-10-21 LAB — VITAMIN B12: Vitamin B-12: 335 pg/mL (ref 180–914)

## 2017-10-21 LAB — ABO/RH: ABO/RH(D): A POS

## 2017-10-21 SURGERY — ESOPHAGOGASTRODUODENOSCOPY (EGD) WITH PROPOFOL
Anesthesia: General

## 2017-10-21 MED ORDER — PEG 3350-KCL-NA BICARB-NACL 420 G PO SOLR
4000.0000 mL | Freq: Once | ORAL | Status: DC
  Filled 2017-10-21: qty 4000

## 2017-10-21 MED ORDER — ONDANSETRON HCL 4 MG/2ML IJ SOLN: 4.0000 mg | Freq: Four times a day (QID) | INTRAMUSCULAR | Status: DC | PRN

## 2017-10-21 MED ORDER — ACETAMINOPHEN 650 MG RE SUPP: 650.0000 mg | Freq: Four times a day (QID) | RECTAL | Status: DC | PRN

## 2017-10-21 MED ORDER — VITAMIN B-1 100 MG PO TABS: 100.0000 mg | ORAL_TABLET | Freq: Every day | ORAL | Status: DC

## 2017-10-21 MED ORDER — FOLIC ACID 1 MG PO TABS: 1.0000 mg | ORAL_TABLET | Freq: Every day | ORAL | Status: DC

## 2017-10-21 MED ORDER — THIAMINE HCL 100 MG/ML IJ SOLN: 100.0000 mg | Freq: Every day | INTRAMUSCULAR | Status: DC

## 2017-10-21 MED ORDER — SODIUM CHLORIDE 0.9 % IV SOLN: INTRAVENOUS | Status: DC

## 2017-10-21 MED ORDER — SODIUM CHLORIDE 0.9 % IV SOLN
80.0000 mg | Freq: Once | INTRAVENOUS | Status: AC
  Administered 2017-10-21: 80 mg via INTRAVENOUS
  Filled 2017-10-21: qty 80

## 2017-10-21 MED ORDER — PANTOPRAZOLE SODIUM 40 MG IV SOLR: 40.0000 mg | Freq: Two times a day (BID) | INTRAVENOUS | Status: DC

## 2017-10-21 MED ORDER — ONDANSETRON HCL 4 MG PO TABS: 4.0000 mg | ORAL_TABLET | Freq: Four times a day (QID) | ORAL | Status: DC | PRN

## 2017-10-21 MED ORDER — LORAZEPAM 2 MG/ML IJ SOLN: 1.0000 mg | Freq: Four times a day (QID) | INTRAMUSCULAR | Status: DC | PRN

## 2017-10-21 MED ORDER — SODIUM CHLORIDE 0.9 % IV SOLN: 8.0000 mg/h | INTRAVENOUS | Status: DC

## 2017-10-21 MED ORDER — PANTOPRAZOLE SODIUM 40 MG IV SOLR
8.0000 mg/h | INTRAVENOUS | Status: DC
  Administered 2017-10-21 – 2017-10-22 (×3): 8 mg/h via INTRAVENOUS
  Filled 2017-10-21 (×3): qty 80

## 2017-10-21 MED ORDER — ADULT MULTIVITAMIN W/MINERALS CH: 1.0000 | ORAL_TABLET | Freq: Every day | ORAL | Status: DC

## 2017-10-21 MED ORDER — ACETAMINOPHEN 325 MG PO TABS: 650.0000 mg | ORAL_TABLET | Freq: Four times a day (QID) | ORAL | Status: DC | PRN

## 2017-10-21 MED ORDER — LORAZEPAM 1 MG PO TABS: 1.0000 mg | ORAL_TABLET | Freq: Four times a day (QID) | ORAL | Status: DC | PRN

## 2017-10-21 MED ORDER — SODIUM CHLORIDE 0.9 % IV SOLN: 10.0000 mL/h | Freq: Once | INTRAVENOUS | Status: DC

## 2017-10-21 MED ORDER — SODIUM CHLORIDE 0.9 % IV BOLUS (SEPSIS)
1000.0000 mL | Freq: Once | INTRAVENOUS | Status: AC
  Administered 2017-10-21: 1000 mL via INTRAVENOUS

## 2017-10-21 MED ORDER — SODIUM CHLORIDE 0.9 % IV SOLN
INTRAVENOUS | Status: DC
  Administered 2017-10-21 – 2017-10-22 (×4): via INTRAVENOUS

## 2017-10-21 NOTE — H&P (Signed)
[facility] NAME: Sheri Butler    MR#:  [id number]  DATE OF BIRTH:  [birth year]  DATE OF ADMISSION:  10/20/2017  PRIMARY CARE PHYSICIAN: Denton Lank, MD   REQUESTING/REFERRING PHYSICIAN:   CHIEF COMPLAINT:   Chief Complaint  Patient presents with  . Rectal Bleeding    HISTORY OF PRESENT ILLNESS: Sheri Butler  is a 71 y.o. female with a known history of hyperlipidemia, hypertension presented to the emergency room with bright red blood per rectum. Patient had couple of episodes yesterday. She says she had 5 episodes of bright red blood per rectum. Patient has generalized weakness and fatigue. No vomiting of blood. No complaints of any abdominal pain. Patient takes a lot of ibuprofen for pain. And currently on oral aspirin at home. Hemoglobin has been stable greater than 11 in the emergency room. Hospitalist service was consulted for further care.  PAST MEDICAL HISTORY:   Past Medical History:  Diagnosis Date  . Hyperlipidemia   . Hypertension     PAST SURGICAL HISTORY:  Past Surgical History:  Procedure Laterality Date  . none      SOCIAL HISTORY:  Social History   Tobacco Use  . Smoking status: Never Smoker  . Smokeless tobacco: Never Used  Substance Use Topics  . Alcohol use: Yes    Alcohol/week: 8.4 oz    Types: 14 Cans of beer per week    Comment: everyday    FAMILY HISTORY:  Family History  Problem Relation Age of Onset  . Hypertension Other   . CAD Neg Hx   . Diabetes Mellitus II Neg Hx     DRUG ALLERGIES:  Allergies  Allergen Reactions  . Sulfa Antibiotics Anaphylaxis    REVIEW OF SYSTEMS:   CONSTITUTIONAL: No fever, has weakness.  EYES: No blurred or double vision.  EARS, NOSE, AND THROAT: No tinnitus or ear pain.  RESPIRATORY: No cough, shortness of breath, wheezing or hemoptysis.  CARDIOVASCULAR: No chest pain, orthopnea, edema.  GASTROINTESTINAL: No nausea, vomiting, diarrhea or  abdominal pain.  Has rectal bleed. GENITOURINARY: No dysuria, hematuria.  ENDOCRINE: No polyuria, nocturia,  HEMATOLOGY: No anemia, easy bruising or bleeding SKIN: No rash or lesion. MUSCULOSKELETAL: No joint pain or arthritis.   NEUROLOGIC: No tingling, numbness, weakness.  PSYCHIATRY: No anxiety or depression.   MEDICATIONS AT HOME:  Prior to Admission medications   Medication Sig Start Date End Date Taking? Authorizing Provider  aspirin EC 81 MG tablet Take 81 mg by mouth daily.   Yes [provider]  atenolol (TENORMIN) 25 MG tablet Take 25 mg by mouth daily.  10/19/15  Yes [provider]  IBU 800 MG tablet Take 1 tablet every 6 (six) hours as needed by mouth. 09/28/17  Yes [provider]  losartan (COZAAR) 25 MG tablet Take 1 tablet daily by mouth. 08/03/17  Yes [provider]  pravastatin (PRAVACHOL) 80 MG tablet Take 1 tablet by mouth daily. 10/19/15  Yes [provider]  acetaminophen (TYLENOL) 325 MG tablet Take 2 tablets (650 mg total) by mouth every 6 (six) hours as needed for mild pain (or Fever >/= 101). Patient not taking: Reported on 10/20/2017 11/12/15   Nicholes Mango, MD  ondansetron (ZOFRAN) 4 MG tablet Take 1 tablet (4 mg total) by mouth daily as needed for nausea or vomiting. Patient not taking: Reported on 10/20/2017 07/26/15   Earleen Newport, MD  ranitidine (ZANTAC) 150 MG tablet Take 1  tablet (150 mg total) by mouth at bedtime. Patient taking differently: Take 150 mg by mouth at bedtime as needed.  07/26/15 07/25/16  Earleen Newport, MD      PHYSICAL EXAMINATION:   VITAL SIGNS: Blood pressure 97/60, pulse 88, temperature 98.4 F (36.9 C), resp. rate 18, height 5' (1.524 m), weight 86.2 kg (190 lb), SpO2 98 %.  GENERAL:  71 y.o.-year-old patient lying in the bed with no acute distress.  EYES: Pupils equal, round, reactive to light and accommodation. No scleral icterus. Extraocular muscles intact.  HEENT: Head  atraumatic, normocephalic. Oropharynx dry and nasopharynx clear.  NECK:  Supple, no jugular venous distention. No thyroid enlargement, no tenderness.  LUNGS: Normal breath sounds bilaterally, no wheezing, rales,rhonchi or crepitation. No use of accessory muscles of respiration.  CARDIOVASCULAR: S1, S2 normal. No murmurs, rubs, or gallops.  ABDOMEN: Soft, nontender, nondistended. Bowel sounds present. No organomegaly or mass.  EXTREMITIES: No pedal edema, cyanosis, or clubbing.  NEUROLOGIC: Cranial nerves II through XII are intact. Muscle strength 5/5 in all extremities. Sensation intact. Gait not checked.  PSYCHIATRIC: The patient is alert and oriented x 3.  SKIN: No obvious rash, lesion, or ulcer.   LABORATORY PANEL:   CBC Recent Labs  Lab 10/20/17 2300  WBC 4.7  HGB 11.5*  HCT 35.4  PLT 182  MCV 97.6  MCH 31.7  MCHC 32.5  RDW 14.5   ------------------------------------------------------------------------------------------------------------------  Chemistries  Recent Labs  Lab 10/20/17 2300  NA 134*  K 4.3  CL 99*  CO2 24  GLUCOSE 128*  BUN 18  CREATININE 0.70  CALCIUM 9.6  AST 26  ALT 16  ALKPHOS 49  BILITOT 0.6   ------------------------------------------------------------------------------------------------------------------ estimated creatinine clearance is 62 mL/min (by C-G formula based on SCr of 0.7 mg/dL). ------------------------------------------------------------------------------------------------------------------ No results for input(s): TSH, T4TOTAL, T3FREE, THYROIDAB in the last 72 hours.  Invalid input(s): FREET3   Coagulation profile No results for input(s): INR, PROTIME in the last 168 hours. ------------------------------------------------------------------------------------------------------------------- No results for input(s): DDIMER in the last 72  hours. -------------------------------------------------------------------------------------------------------------------  Cardiac Enzymes No results for input(s): CKMB, TROPONINI, MYOGLOBIN in the last 168 hours.  Invalid input(s): CK ------------------------------------------------------------------------------------------------------------------ Invalid input(s): POCBNP  ---------------------------------------------------------------------------------------------------------------  Urinalysis    Component Value Date/Time   COLORURINE YELLOW (A) 11/08/2015 2221   APPEARANCEUR HAZY (A) 11/08/2015 2221   LABSPEC 1.006 11/08/2015 2221   PHURINE 6.0 11/08/2015 2221   GLUCOSEU NEGATIVE 11/08/2015 2221   HGBUR 1+ (A) 11/08/2015 2221   BILIRUBINUR NEGATIVE 11/08/2015 2221   KETONESUR NEGATIVE 11/08/2015 2221   PROTEINUR 30 (A) 11/08/2015 2221   NITRITE NEGATIVE 11/08/2015 2221   LEUKOCYTESUR 2+ (A) 11/08/2015 2221     RADIOLOGY: No results found.  EKG: Orders placed or performed during the hospital encounter of 10/20/17  . ED EKG  . ED EKG  . EKG 12-Lead  . EKG 12-Lead    IMPRESSION AND PLAN: 71 year old female patient with history of hypertension, hyperlipidemia presented to the emergency room with rectal bleed.  Admitting diagnosis 1. Acute gastrointestinal bleeding 2. Hyponatremia 3. Hypertension 4. Hyperlipidemia Treatment plan Admit patient to medical floor Serial hemoglobin and hematocrit monitoring IV fluid hydration Nothing by mouth IV Protonix drip Gastroenterology consultation for colonoscopy and endoscopy Follow-up electrolytes  All the records are reviewed and case discussed with ED provider. Management plans discussed with the patient, family and they are in agreement.  CODE STATUS:FULL CODE Code Status History    Date Active Date Inactive Code Status Order ID Comments  User Context   11/08/2015 22:53 11/12/2015 15:00 Full Code [id number]  Hower,  Aaron Mose, MD ED       TOTAL TIME TAKING CARE OF THIS PATIENT: 50 minutes.    Saundra Shelling M.D on 10/21/2017 at 2:34 AM  Between 7am to 6pm - Pager - [phone]  After 6pm go to [email or web address] - password [email or web address] [facility]  [facility]  Office  [phone]  CC: Primary care physician; Denton Lank, MD

## 2017-10-21 NOTE — ED Notes (Signed)
EDP at bedside.  Patient had another dark red bloody BM.

## 2017-10-21 NOTE — Progress Notes (Signed)
Chaplain rounding unit visited pt. Pt states she is doing much better this morning today. Pt talked about her health struggles but was optimistic that she will get better soon. [address] provided emphatic listening and a ministry of pastoral presence.   10/21/17 1000  Clinical Encounter Type  Visited With Patient  Visit Type Initial;Spiritual support  Referral From Chaplain  Spiritual Encounters  Spiritual Needs Other (Comment)

## 2017-10-21 NOTE — ED Notes (Signed)
Admitting MD at bedside.

## 2017-10-21 NOTE — Progress Notes (Signed)
Patient has no melena or bloody stool. Vital signs are stable.  Physical examination is unremarkable. Hemoglobin decreased to 10.3, repeat 10.9. Per Dr. Vicente Males, EGD and colonoscopy tomorrow Continue current treatment.  Time spent: 28 minutes.

## 2017-10-21 NOTE — ED Notes (Signed)
Blood consent signed by patient.

## 2017-10-21 NOTE — ED Provider Notes (Signed)
[facility] Emergency Department Provider Note    First MD Initiated Contact with Patient 10/20/17 2320     (approximate)  I have reviewed the triage vital signs and the nursing notes.   HISTORY  Chief Complaint Rectal Bleeding    HPI Sheri Butler is a 71 y.o. female with below list of chronic medical conditions including gout presents to the emergency department with illness bright red blood per rectum.  Patient states that she had 6 large bloody bowel movements today.  Patient states that she began taking 800 mg of ibuprofen every 5 hours 3 days ago.  Patient denies any abdominal pain.  Patient denies any dizziness or palpitations.  Patient denies any chest pain or shortness of breath.  Past Medical History:  Diagnosis Date  . Hyperlipidemia   . Hypertension     Patient Active Problem List   Diagnosis Date Noted  . Protein-calorie malnutrition, severe 11/10/2015  . Acute kidney injury ([place]) 11/08/2015    History reviewed. No pertinent surgical history.  Prior to Admission medications   Medication Sig Start Date End Date Taking? Authorizing Provider  aspirin EC 81 MG tablet Take 81 mg by mouth daily.   Yes [provider]  atenolol (TENORMIN) 25 MG tablet Take 25 mg by mouth daily.  10/19/15  Yes [provider]  IBU 800 MG tablet Take 1 tablet every 6 (six) hours as needed by mouth. 09/28/17  Yes [provider]  losartan (COZAAR) 25 MG tablet Take 1 tablet daily by mouth. 08/03/17  Yes [provider]  pravastatin (PRAVACHOL) 80 MG tablet Take 1 tablet by mouth daily. 10/19/15  Yes [provider]  acetaminophen (TYLENOL) 325 MG tablet Take 2 tablets (650 mg total) by mouth every 6 (six) hours as needed for mild pain (or Fever >/= 101). Patient not taking: Reported on 10/20/2017 11/12/15   Nicholes Mango, MD  ondansetron (ZOFRAN) 4 MG tablet Take 1 tablet (4 mg total) by mouth daily as needed for  nausea or vomiting. Patient not taking: Reported on 10/20/2017 07/26/15   Earleen Newport, MD  ranitidine (ZANTAC) 150 MG tablet Take 1 tablet (150 mg total) by mouth at bedtime. Patient taking differently: Take 150 mg by mouth at bedtime as needed.  07/26/15 07/25/16  Earleen Newport, MD    Allergies Sulfa antibiotics  Family History  Problem Relation Age of Onset  . Hypertension Other     Social History Social History   Tobacco Use  . Smoking status: Never Smoker  . Smokeless tobacco: Never Used  Substance Use Topics  . Alcohol use: Yes    Alcohol/week: 8.4 oz    Types: 14 Cans of beer per week    Comment: everyday  . Drug use: No    Review of Systems Constitutional: No fever/chills Eyes: No visual changes. ENT: No sore throat. Cardiovascular: Denies chest pain. Respiratory: Denies shortness of breath. Gastrointestinal: No abdominal pain.  No nausea, no vomiting.  No diarrhea.  No constipation.  Positive for bright red blood per rectum Genitourinary: Negative for dysuria. Musculoskeletal: Negative for neck pain.  Negative for back pain. Integumentary: Negative for rash. Neurological: Negative for headaches, focal weakness or numbness.   ____________________________________________   PHYSICAL EXAM:  VITAL SIGNS: ED Triage Vitals [10/20/17 2259]  Enc Vitals Group     BP 99/75     Pulse Rate 97     Resp 20     Temp 98.4 F (36.9 C)  Temp src      SpO2 97 %     Weight 86.2 kg (190 lb)     Height 1.524 m (5')     Head Circumference      Peak Flow      Pain Score      Pain Loc      Pain Edu?      Excl. in [place]?     Constitutional: Alert and oriented. Well appearing and in no acute distress. Eyes: Conjunctivae are pale. Head: Atraumatic. Mouth/Throat: Mucous membranes are moist. Oropharynx non-erythematous. Neck: No stridor.   Cardiovascular: Normal rate, regular rhythm. Good peripheral circulation. Grossly normal heart sounds. Respiratory:  Normal respiratory effort.  No retractions. Lungs CTAB. Gastrointestinal: Soft and nontender. No distention. :  Musculoskeletal: No lower extremity tenderness nor edema. No gross deformities of extremities. Neurologic:  Normal speech and language. No gross focal neurologic deficits are appreciated.  Skin:  Skin is warm, dry and intact. No rash noted. Psychiatric: Mood and affect are normal. Speech and behavior are normal.  ____________________________________________   LABS (all labs ordered are listed, but only abnormal results are displayed)  Labs Reviewed  COMPREHENSIVE METABOLIC PANEL - Abnormal; Notable for the following components:      Result Value   Sodium 134 (*)    Chloride 99 (*)    Glucose, Bld 128 (*)    All other components within normal limits  CBC - Abnormal; Notable for the following components:   RBC 3.63 (*)    Hemoglobin 11.5 (*)    All other components within normal limits  POC OCCULT BLOOD, ED  TYPE AND SCREEN  PREPARE RBC (CROSSMATCH)     Critical Care performed: CRITICAL CARE Performed by: Gregor Hams   Total critical care time: 30 minutes  Critical care time was exclusive of separately billable procedures and treating other patients.  Critical care was necessary to treat or prevent imminent or life-threatening deterioration.  Critical care was time spent personally by me on the following activities: development of treatment plan with patient and/or surrogate as well as nursing, discussions with consultants, evaluation of patient's response to treatment, examination of patient, obtaining history from patient or surrogate, ordering and performing treatments and interventions, ordering and review of laboratory studies, ordering and review of radiographic studies, pulse oximetry and re-evaluation of patient's condition.   Procedures   ____________________________________________   INITIAL IMPRESSION / ASSESSMENT AND PLAN / ED COURSE  As part  of my medical decision making, I reviewed the following data within the electronic MEDICAL RECORD [id number] year old female presented with above-stated history and physical exam consistent with hemorrhagic shock secondary to acute upper GI bleed most likely secondary to ibuprofen use.  Patient noted to be hypotensive on arrival with a systolic blood pressure of 93 in a patient with a known history of hypertension.  I suspect the patient is not tachycardic secondary to the fact that she is on atenolol.  Patient given 2 L IV normal saline bolus.  2 units of packed red blood cells were ordered and will be administered.  In addition patient given IV Protonix bolus followed by infusion.  Infusion.  Patient discussed with Dr. Jannifer Franklin for hospital admission for further evaluation and management. ____________________________________________  FINAL CLINICAL IMPRESSION(S) / ED DIAGNOSES  Final diagnoses:  Acute GI bleeding  Hemorrhagic shock (HCC)     MEDICATIONS GIVEN DURING THIS VISIT:  Medications  0.9 %  sodium chloride infusion (not administered)  sodium chloride 0.9 % bolus  1,000 mL (1,000 mLs Intravenous New Bag/Given 10/21/17 0016)  sodium chloride 0.9 % bolus 1,000 mL (1,000 mLs Intravenous New Bag/Given 10/21/17 0015)     ED Discharge Orders    None       Note:  This document was prepared using Dragon voice recognition software and may include unintentional dictation errors.    Gregor Hams, MD 10/21/17 [phone]

## 2017-10-21 NOTE — Consult Note (Signed)
Jonathon Bellows , MD [address] [place], [place], [place], [address] 3940 [address] [place], [place], [place], [address] Phone: [phone]  Fax: [phone]  Consultation  Referring Provider:     Dr Bridgett Larsson  Primary Care Physician:  Denton Lank, MD Primary Gastroenterologist: Dr Gustavo Lah   Reason for Consultation:     GI bleed   Date of Admission:  10/20/2017 Date of Consultation:  10/21/2017         HPI:   Sheri Butler is a 71 y.o. female was admitted on 10/21/2017 when she presented with bright red blood per rectum.  She has a history of NSAID use.  On admission her hemoglobin was 11.5 g she had a hemoglobin of 10.5 g 1 year back her MCV on this admission is 97.6 hemoglobin this morning was 10.7 g.  Creatinine of 0.61 and normal hepatic function panel.  She says she has been taking Ibuprofen 1 every 5 hours for thepast 2 months on and off for leg pains. She denies any abdominal pain , says yesterday she had black colored stool with red blood, this morning was getting lighter. She wants to know when she can eat  Denies use of any other blood thinners. Denies any hematemesis. Denies prior GI evaluation . No family history of colon cancer or polyps.    Past Medical History:  Diagnosis Date  . Hyperlipidemia   . Hypertension     Past Surgical History:  Procedure Laterality Date  . none      Prior to Admission medications   Medication Sig Start Date End Date Taking? Authorizing Provider  aspirin EC 81 MG tablet Take 81 mg by mouth daily.   Yes [provider]  atenolol (TENORMIN) 25 MG tablet Take 25 mg by mouth daily.  10/19/15  Yes [provider]  IBU 800 MG tablet Take 1 tablet every 6 (six) hours as needed by mouth. 09/28/17  Yes [provider]  losartan (COZAAR) 25 MG tablet Take 1 tablet daily by mouth. 08/03/17  Yes [provider]  pravastatin (PRAVACHOL) 80 MG tablet Take 1 tablet by mouth daily. 10/19/15  Yes [provider]  acetaminophen (TYLENOL) 325 MG tablet Take 2 tablets (650 mg total) by mouth every 6 (six) hours as needed for mild pain (or Fever >/= 101). Patient not taking: Reported on 10/20/2017 11/12/15   Nicholes Mango, MD  ondansetron (ZOFRAN) 4 MG tablet Take 1 tablet (4 mg total) by mouth daily as needed for nausea or vomiting. Patient not taking: Reported on 10/20/2017 07/26/15   Earleen Newport, MD  ranitidine (ZANTAC) 150 MG tablet Take 1 tablet (150 mg total) by mouth at bedtime. Patient taking differently: Take 150 mg by mouth at bedtime as needed.  07/26/15 07/25/16  Earleen Newport, MD    Family History  Problem Relation Age of Onset  . Hypertension Other   . CAD Neg Hx   . Diabetes Mellitus II Neg Hx      Social History   Tobacco Use  . Smoking status: Never Smoker  . Smokeless tobacco: Never Used  Substance Use Topics  . Alcohol use: Yes    Alcohol/week: 8.4 oz    Types: 14 Cans of beer per week    Comment: everyday  . Drug use: No    Allergies as of 10/20/2017 - Review Complete 10/20/2017  Allergen Reaction Noted  . Sulfa antibiotics Anaphylaxis 12/14/2015    Review of Systems:  All systems reviewed and negative except where noted in HPI.   Physical Exam:  Vital signs in last 24 hours: Temp:  [98.1 F (36.7 C)-98.4 F (36.9 C)] 98.1 F (36.7 C) (11/14 0810) Pulse Rate:  [76-97] 76 (11/14 0810) Resp:  [16-20] 18 (11/14 0810) BP: (93-108)/(34-90) 108/34 (11/14 0810) SpO2:  [97 %-100 %] 100 % (11/14 0810) Weight:  [190 lb (86.2 kg)-220 lb 6.4 oz (100 kg)] 220 lb 6.4 oz (100 kg) (11/14 0239) Last BM Date: 10/21/17 General:   Pleasant, cooperative in NAD Head:  Normocephalic and atraumatic. Eyes:   No icterus.   Conjunctiva pink. PERRLA. Ears:  Normal auditory acuity. Neck:  Supple; no masses or thyroidomegaly Lungs: Respirations even and unlabored. Lungs clear to auscultation bilaterally.   No wheezes, crackles, or rhonchi.  Heart:  Regular  rate and rhythm;  Without murmur, clicks, rubs or gallops Abdomen:  Soft, nondistended, nontender. Normal bowel sounds. No appreciable masses or hepatomegaly.  No rebound or guarding.  Neurologic:  Alert and oriented x3;  grossly normal neurologically. Skin:  Intact without significant lesions or rashes. Cervical Nodes:  No significant cervical adenopathy. Psych:  Alert and cooperative. Normal affect.  LAB RESULTS: Recent Labs    10/20/17 2300 10/21/17 0352  WBC 4.7  --   HGB 11.5* 10.7*  HCT 35.4 33.0*  PLT 182  --    BMET Recent Labs    10/20/17 2300 10/21/17 0352  NA 134* 135  K 4.3 4.8  CL 99* 104  CO2 24 25  GLUCOSE 128* 107*  BUN 18 17  CREATININE 0.70 0.61  CALCIUM 9.6 8.6*   LFT Recent Labs    10/20/17 2300  PROT 6.7  ALBUMIN 3.6  AST 26  ALT 16  ALKPHOS 49  BILITOT 0.6   PT/INR No results for input(s): LABPROT, INR in the last 72 hours.  STUDIES: No results found.    Impression / Plan:   [name] is a 71 y.o. y/o female with rectal bleeding.  She has a history of normocytic anemia, no prior GI evaluation.   Plan #1 iron studies, B12, folate #2 EGD and colonoscopy tomorrow #3 stop NSAID use.  # 4 Stool H pylori antigen   I have discussed alternative options, risks & benefits,  which include, but are not limited to, bleeding, infection, perforation,respiratory complication & drug reaction.  The patient agrees with this plan & written consent will be obtained.      Thank you for involving me in the care of this patient.      LOS: 0 days   Jonathon Bellows, MD  10/21/2017, 10:35 AM

## 2017-10-22 ENCOUNTER — Inpatient Hospital Stay: Payer: Medicare Other | Admitting: Anesthesiology

## 2017-10-22 ENCOUNTER — Inpatient Hospital Stay: Admit: 2017-10-22 | Payer: Medicare Other | Admitting: Gastroenterology

## 2017-10-22 ENCOUNTER — Encounter
Admission: EM | Disposition: A | Discharge status: Home / Self Care | Payer: Self-pay | Source: Home / Self Care | Attending: Emergency Medicine

## 2017-10-22 ENCOUNTER — Encounter: Payer: Self-pay | Admitting: Anesthesiology

## 2017-10-22 DIAGNOSIS — K625 Hemorrhage of anus and rectum: Secondary | ICD-10-CM | POA: Diagnosis not present

## 2017-10-22 DIAGNOSIS — K2970 Gastritis, unspecified, without bleeding: Secondary | ICD-10-CM | POA: Diagnosis not present

## 2017-10-22 DIAGNOSIS — D128 Benign neoplasm of rectum: Secondary | ICD-10-CM | POA: Diagnosis not present

## 2017-10-22 DIAGNOSIS — K629 Disease of anus and rectum, unspecified: Secondary | ICD-10-CM | POA: Diagnosis not present

## 2017-10-22 DIAGNOSIS — K5730 Diverticulosis of large intestine without perforation or abscess without bleeding: Secondary | ICD-10-CM | POA: Diagnosis not present

## 2017-10-22 HISTORY — PX: ESOPHAGOGASTRODUODENOSCOPY (EGD) WITH PROPOFOL: SHX5813

## 2017-10-22 HISTORY — PX: COLONOSCOPY WITH PROPOFOL: SHX5780

## 2017-10-22 LAB — GLIA (IGA/G) + TTG IGA
ANTIGLIADIN ABS, IGA: 4 U (ref 0–19)
GLIADIN IGG: 2 U (ref 0–19)

## 2017-10-22 LAB — HEMOGLOBIN AND HEMATOCRIT, BLOOD
HCT: 28.1 % — ABNORMAL LOW (ref 35.0–47.0)
Hemoglobin: 9.1 g/dL — ABNORMAL LOW (ref 12.0–16.0)

## 2017-10-22 SURGERY — COLONOSCOPY WITH PROPOFOL
Anesthesia: General

## 2017-10-22 MED ORDER — PROPOFOL 500 MG/50ML IV EMUL
INTRAVENOUS | Status: DC | PRN
  Administered 2017-10-22: 50 ug/kg/min via INTRAVENOUS

## 2017-10-22 MED ORDER — GLYCOPYRROLATE 0.2 MG/ML IJ SOLN
INTRAMUSCULAR | Status: DC | PRN
  Administered 2017-10-22: 0.2 mg via INTRAVENOUS

## 2017-10-22 MED ORDER — INSULIN GLARGINE 100 UNIT/ML ~~LOC~~ SOLN
5.0000 [IU] | Freq: Every day | SUBCUTANEOUS | Status: DC
  Filled 2017-10-22: qty 0.05

## 2017-10-22 MED ORDER — PROPOFOL 10 MG/ML IV BOLUS
INTRAVENOUS | Status: AC
  Filled 2017-10-22: qty 20

## 2017-10-22 MED ORDER — ONDANSETRON HCL 4 MG/2ML IJ SOLN: 4.0000 mg | Freq: Once | INTRAMUSCULAR | Status: DC | PRN

## 2017-10-22 MED ORDER — PANTOPRAZOLE SODIUM 40 MG PO TBEC
40.0000 mg | DELAYED_RELEASE_TABLET | Freq: Every day | ORAL | Status: DC
Start: 2017-10-22 — End: 2017-10-22

## 2017-10-22 MED ORDER — PANTOPRAZOLE SODIUM 40 MG PO TBEC: 40.0000 mg | DELAYED_RELEASE_TABLET | Freq: Every day | ORAL | 0 refills | Status: DC

## 2017-10-22 MED ORDER — PROPOFOL 10 MG/ML IV BOLUS
INTRAVENOUS | Status: DC | PRN
  Administered 2017-10-22: 20 mg via INTRAVENOUS
  Administered 2017-10-22 (×10): 10 mg via INTRAVENOUS
  Administered 2017-10-22: 20 mg via INTRAVENOUS
  Administered 2017-10-22 (×2): 10 mg via INTRAVENOUS

## 2017-10-22 MED ORDER — FENTANYL CITRATE (PF) 100 MCG/2ML IJ SOLN: 25.0000 ug | INTRAMUSCULAR | Status: DC | PRN

## 2017-10-22 NOTE — Op Note (Signed)
[facility] Gastroenterology Patient Name: Sheri Butler Procedure Date: 10/22/2017 10:57 AM MRN: [id number] Account #: [id number] Date of Birth: 03/22/1946 Admit Type: Inpatient Age: 71 Room: [facility] ENDO ROOM 3 Gender: Female Note Status: Finalized Procedure:            Upper GI endoscopy Indications:          Hematochezia Providers:            Jonathon Bellows MD, MD Medicines:            Monitored Anesthesia Care Complications:        No immediate complications. Procedure:            Pre-Anesthesia Assessment:                       - Prior to the procedure, a History and Physical was                        performed, and patient medications, allergies and                        sensitivities were reviewed. The patient's tolerance of                        previous anesthesia was reviewed.                       - The risks and benefits of the procedure and the                        sedation options and risks were discussed with the                        patient. All questions were answered and informed                        consent was obtained.                       - ASA Grade Assessment: III - A patient with severe                        systemic disease.                       After obtaining informed consent, the endoscope was                        passed under direct vision. Throughout the procedure,                        the patient's blood pressure, pulse, and oxygen                        saturations were monitored continuously. The Endoscope                        was introduced through the mouth, and advanced to the                        third part of duodenum. The upper GI endoscopy was  accomplished with ease. The patient tolerated the                        procedure well. Findings:      The examined duodenum was normal.      Localized moderate inflammation characterized by congestion (edema),       erosions, erythema and  aphthous ulcerations was found in the gastric       antrum. Biopsies were taken with a cold forceps for histology.      One 10 mm polyp with no bleeding was found in the lower third of the       esophagus. The lesion appeared to be submucosal with normal overlying       mucosa      The cardia and gastric fundus were normal on retroflexion. Impression:           - Normal examined duodenum.                       - Gastritis. Biopsied.                       - Esophageal polyp(s) were found. Recommendation:       - Await pathology results.                       - Outpatient EUS to evaluate esophageal sub mucosal                        lesion at the lower end.                       - Perform a colonoscopy today. Procedure Code(s):    --- Professional ---                       [phone], Esophagogastroduodenoscopy, flexible, transoral;                        with biopsy, single or multiple Diagnosis Code(s):    --- Professional ---                       K29.70, Gastritis, unspecified, without bleeding                       K22.8, Other specified diseases of esophagus                       K92.1, Melena (includes Hematochezia) CPT copyright 2016 American Medical Association. All rights reserved. The codes documented in this report are preliminary and upon coder review may  be revised to meet current compliance requirements. Jonathon Bellows, MD Jonathon Bellows MD, MD 10/22/2017 11:09:00 AM This report has been signed electronically. Number of Addenda: 0 Note Initiated On: 10/22/2017 10:57 AM      [facility]

## 2017-10-22 NOTE — Transfer of Care (Signed)
2Immediate Anesthesia Transfer of Care Note  Patient: Sheri Butler  Procedure(s) Performed: COLONOSCOPY WITH PROPOFOL (N/A ) ESOPHAGOGASTRODUODENOSCOPY (EGD) WITH PROPOFOL (N/A ) 2 Patient Location: PACU  Anesthesia Type:MAC  Level of Consciousness: awake  Airway & Oxygen Therapy: Patient Spontanous Breathing  Post-op Assessment: Report given to RN  Post vital signs: Reviewed  Last Vitals:  Vitals:   10/22/17 1042 10/22/17 1138  BP: 140/74 109/62  Pulse: 71 84  Resp: 16 13  Temp: (!) 35.8 C 36.6 C  SpO2: 100% 100%    Last Pain:  Vitals:   10/22/17 1138  TempSrc: Tympanic         Complications: No apparent anesthesia complications

## 2017-10-22 NOTE — Progress Notes (Signed)
Telephone report called to Thedora Hinders, RN in Endo.

## 2017-10-22 NOTE — Anesthesia Postprocedure Evaluation (Signed)
Anesthesia Post Note  Patient: Sheri Butler  Procedure(s) Performed: COLONOSCOPY WITH PROPOFOL (N/A ) ESOPHAGOGASTRODUODENOSCOPY (EGD) WITH PROPOFOL (N/A )  Patient location during evaluation: PACU Anesthesia Type: General Level of consciousness: awake and alert and oriented Pain management: pain level controlled Vital Signs Assessment: post-procedure vital signs reviewed and stable Respiratory status: spontaneous breathing Cardiovascular status: blood pressure returned to baseline Anesthetic complications: no     Last Vitals:  Vitals:   10/22/17 1148 10/22/17 1338  BP:  133/76  Pulse:  90  Resp:  16  Temp:  36.6 C  SpO2: 97% 98%    Last Pain:  Vitals:   10/22/17 1338  TempSrc: Oral                 Durward Matranga

## 2017-10-22 NOTE — Discharge Summary (Signed)
Sheri Butler at [place] NAME: Sheri Butler    MR#:  [id number]  DATE OF BIRTH:  Aug 28, 1946  DATE OF ADMISSION:  10/20/2017   ADMITTING PHYSICIAN: Saundra Shelling, MD  DATE OF DISCHARGE: 10/22/2017  PRIMARY CARE PHYSICIAN: Denton Lank, MD   ADMISSION DIAGNOSIS:  Hemorrhagic shock ([place]) [R57.8] Acute GI bleeding [K92.2] DISCHARGE DIAGNOSIS:  Active Problems:   GI bleed  SECONDARY DIAGNOSIS:   Past Medical History:  Diagnosis Date  . Hyperlipidemia   . Hypertension    HOSPITAL COURSE:   1. GIB due to rectal mass/plyps or gastritis.  No active bleeding after admission. Per Dr. Vicente Males, EGD show esophageal polyp and gastritis.  Colonoscopy showed rectum mass and polyp, biopsy done.  Follow-up Dr. Vicente Males as outpatient in 2 weeks. The patient has been treated with IV Protonix, changed to p.o. Protonix daily for gastritis.  Still hold aspirin. Anemia of chronic disease and acute blood loss due to GI bleeding. Hemoglobin decreased to 9.1.  Stable.  2. Hyponatremia.  Improved with normal saline IV. 3. Hypertension.  Continue losartan and Lopressor. 4. Hyperlipidemia.  Continue pravastatin Discussed with Dr. Vicente Males. DISCHARGE CONDITIONS:  Stable, Discharged home today. CONSULTS OBTAINED:   DRUG ALLERGIES:   Allergies  Allergen Reactions  . Sulfa Antibiotics Anaphylaxis   DISCHARGE MEDICATIONS:   Allergies as of 10/22/2017      Reactions   Sulfa Antibiotics Anaphylaxis      Medication List    STOP taking these medications   aspirin EC 81 MG tablet   IBU 800 MG tablet Generic drug:  ibuprofen   ranitidine 150 MG tablet Commonly known as:  ZANTAC     TAKE these medications   acetaminophen 325 MG tablet Commonly known as:  TYLENOL Take 2 tablets (650 mg total) by mouth every 6 (six) hours as needed for mild pain (or Fever >/= 101).   atenolol 25 MG tablet Commonly known as:  TENORMIN Take 25 mg by mouth daily.     losartan 25 MG tablet Commonly known as:  COZAAR Take 1 tablet daily by mouth.   ondansetron 4 MG tablet Commonly known as:  ZOFRAN Take 1 tablet (4 mg total) by mouth daily as needed for nausea or vomiting.   pantoprazole 40 MG tablet Commonly known as:  PROTONIX Take 1 tablet (40 mg total) daily by mouth.   pravastatin 80 MG tablet Commonly known as:  PRAVACHOL Take 1 tablet by mouth daily.        DISCHARGE INSTRUCTIONS:  See AVS. If you experience worsening of your admission symptoms, develop shortness of breath, life threatening emergency, suicidal or homicidal thoughts you must seek medical attention immediately by calling 911 or calling your MD immediately  if symptoms less severe.  You Must read complete instructions/literature along with all the possible adverse reactions/side effects for all the Medicines you take and that have been prescribed to you. Take any new Medicines after you have completely understood and accpet all the possible adverse reactions/side effects.   Please note  You were cared for by a hospitalist during your hospital stay. If you have any questions about your discharge medications or the care you received while you were in the hospital after you are discharged, you can call the unit and asked to speak with the hospitalist on call if the hospitalist that took care of you is not available. Once you are discharged, your primary care physician will handle any further medical issues.  Please note that NO REFILLS for any discharge medications will be authorized once you are discharged, as it is imperative that you return to your primary care physician (or establish a relationship with a primary care physician if you do not have one) for your aftercare needs so that they can reassess your need for medications and monitor your lab values.    On the day of Discharge:  VITAL SIGNS:  Blood pressure 133/76, pulse 90, temperature 97.8 F (36.6 C), temperature  source Oral, resp. rate 16, height 5' (1.524 m), weight 220 lb 6.4 oz (100 kg), SpO2 98 %. PHYSICAL EXAMINATION:  GENERAL:  71 y.o.-year-old patient lying in the bed with no acute distress.  EYES: Pupils equal, round, reactive to light and accommodation. No scleral icterus. Extraocular muscles intact.  HEENT: Head atraumatic, normocephalic. Oropharynx and nasopharynx clear.  NECK:  Supple, no jugular venous distention. No thyroid enlargement, no tenderness.  LUNGS: Normal breath sounds bilaterally, no wheezing, rales,rhonchi or crepitation. No use of accessory muscles of respiration.  CARDIOVASCULAR: S1, S2 normal. No murmurs, rubs, or gallops.  ABDOMEN: Soft, non-tender, non-distended. Bowel sounds present. No organomegaly or mass.  EXTREMITIES: No pedal edema, cyanosis, or clubbing.  NEUROLOGIC: Cranial nerves II through XII are intact. Muscle strength 5/5 in all extremities. Sensation intact. Gait not checked.  PSYCHIATRIC: The patient is alert and oriented x 3.  SKIN: No obvious rash, lesion, or ulcer.  DATA REVIEW:   CBC Recent Labs  Lab 10/20/17 2300  10/22/17 0503  WBC 4.7  --   --   HGB 11.5*   < > 9.1*  HCT 35.4   < > 28.1*  PLT 182  --   --    < > = values in this interval not displayed.    Chemistries  Recent Labs  Lab 10/20/17 2300 10/21/17 0352  NA 134* 135  K 4.3 4.8  CL 99* 104  CO2 24 25  GLUCOSE 128* 107*  BUN 18 17  CREATININE 0.70 0.61  CALCIUM 9.6 8.6*  AST 26  --   ALT 16  --   ALKPHOS 49  --   BILITOT 0.6  --      Microbiology Results  No results found for this or any previous visit.  RADIOLOGY:  No results found.   Management plans discussed with the patient, family and they are in agreement.  CODE STATUS: Full Code   TOTAL TIME TAKING CARE OF THIS PATIENT: 32 minutes.    Demetrios Loll M.D on 10/22/2017 at 2:14 PM  Between 7am to 6pm - Pager - [phone]  After 6pm go to [email or web address] - Proofreader  [employer]  [facility]  Office  [phone]  CC: Primary care physician; Denton Lank, MD   Note: This dictation was prepared with Dragon dictation along with smaller phrase technology. Any transcriptional errors that result from this process are unintentional.

## 2017-10-22 NOTE — Anesthesia Post-op Follow-up Note (Signed)
Anesthesia QCDR form completed.        

## 2017-10-22 NOTE — H&P (Signed)
Jonathon Bellows, MD [address], [place], [place], [address] [address], [place], [place], [place], [address] Phone: [phone]  Fax: [phone]  Primary Care Physician:  Denton Lank, MD   Pre-Procedure History & Physical: HPI:  Sheri Butler is a 71 y.o. female is here for an endoscopy and colonoscopy    Past Medical History:  Diagnosis Date  . Hyperlipidemia   . Hypertension     Past Surgical History:  Procedure Laterality Date  . none      Prior to Admission medications   Medication Sig Start Date End Date Taking? Authorizing Provider  aspirin EC 81 MG tablet Take 81 mg by mouth daily.   Yes [provider]  atenolol (TENORMIN) 25 MG tablet Take 25 mg by mouth daily.  10/19/15  Yes [provider]  IBU 800 MG tablet Take 1 tablet every 6 (six) hours as needed by mouth. 09/28/17  Yes [provider]  losartan (COZAAR) 25 MG tablet Take 1 tablet daily by mouth. 08/03/17  Yes [provider]  pravastatin (PRAVACHOL) 80 MG tablet Take 1 tablet by mouth daily. 10/19/15  Yes [provider]  acetaminophen (TYLENOL) 325 MG tablet Take 2 tablets (650 mg total) by mouth every 6 (six) hours as needed for mild pain (or Fever >/= 101). Patient not taking: Reported on 10/20/2017 11/12/15   Nicholes Mango, MD  ondansetron (ZOFRAN) 4 MG tablet Take 1 tablet (4 mg total) by mouth daily as needed for nausea or vomiting. Patient not taking: Reported on 10/20/2017 07/26/15   Earleen Newport, MD  ranitidine (ZANTAC) 150 MG tablet Take 1 tablet (150 mg total) by mouth at bedtime. Patient taking differently: Take 150 mg by mouth at bedtime as needed.  07/26/15 07/25/16  Earleen Newport, MD    Allergies as of 10/20/2017 - Review Complete 10/20/2017  Allergen Reaction Noted  . Sulfa antibiotics Anaphylaxis 12/14/2015    Family History  Problem Relation Age of Onset  . Hypertension Other   . CAD Neg Hx   . Diabetes  Mellitus II Neg Hx     Social History   Socioeconomic History  . Marital status: Married    Spouse name: Not on file  . Number of children: Not on file  . Years of education: Not on file  . Highest education level: Not on file  Social Needs  . Financial resource strain: Not on file  . Food insecurity - worry: Not on file  . Food insecurity - inability: Not on file  . Transportation needs - medical: Not on file  . Transportation needs - non-medical: Not on file  Occupational History  . Not on file  Tobacco Use  . Smoking status: Never Smoker  . Smokeless tobacco: Never Used  Substance and Sexual Activity  . Alcohol use: Yes    Alcohol/week: 8.4 oz    Types: 14 Cans of beer per week    Comment: everyday  . Drug use: No  . Sexual activity: No  Other Topics Concern  . Not on file  Social History Narrative  . Not on file    Review of Systems: See HPI, otherwise negative ROS  Physical Exam: BP 140/74   Pulse 71   Temp (!) 96.4 F (35.8 C) (Tympanic)   Resp 16   Ht 5' (1.524 m)   Wt 220 lb 6.4 oz (100 kg)   SpO2 100%   BMI 43.04 kg/m  General:  Alert,  pleasant and cooperative in NAD Head:  Normocephalic and atraumatic. Neck:  Supple; no masses or thyromegaly. Lungs:  Clear throughout to auscultation, normal respiratory effort.    Heart:  +S1, +S2, Regular rate and rhythm, No edema. Abdomen:  Soft, nontender and nondistended. Normal bowel sounds, without guarding, and without rebound.   Neurologic:  Alert and  oriented x4;  grossly normal neurologically.  Impression/Plan: Sheri Butler is here for an endoscopy and colonoscopy  to be performed for  evaluation of anemia     Risks, benefits, limitations, and alternatives regarding endoscopy have been reviewed with the patient.  Questions have been answered.  All parties agreeable.   Jonathon Bellows, MD  10/22/2017, 10:54 AM

## 2017-10-22 NOTE — Anesthesia Preprocedure Evaluation (Signed)
Anesthesia Evaluation  Patient identified by MRN, date of birth, ID band Patient awake    Reviewed: Allergy & Precautions, NPO status , Patient's Chart, lab work & pertinent test results, reviewed documented beta blocker date and time   Airway Mallampati: III  TM Distance: >3 FB     Dental  (+) Chipped   Pulmonary neg pulmonary ROS,    Pulmonary exam normal        Cardiovascular hypertension, Pt. on medications and Pt. on home beta blockers Normal cardiovascular exam     Neuro/Psych negative neurological ROS  negative psych ROS   GI/Hepatic Neg liver ROS,   Endo/Other  negative endocrine ROS  Renal/GU Renal disease  negative genitourinary   Musculoskeletal negative musculoskeletal ROS (+)   Abdominal Normal abdominal exam  (+)   Peds negative pediatric ROS (+)  Hematology  (+) anemia ,   Anesthesia Other Findings   Reproductive/Obstetrics                             Anesthesia Physical Anesthesia Plan  ASA: III  Anesthesia Plan: General   Post-op Pain Management:    Induction: Intravenous  PONV Risk Score and Plan:   Airway Management Planned: Nasal Cannula  Additional Equipment:   Intra-op Plan:   Post-operative Plan:   Informed Consent: I have reviewed the patients History and Physical, chart, labs and discussed the procedure including the risks, benefits and alternatives for the proposed anesthesia with the patient or authorized representative who has indicated his/her understanding and acceptance.   Dental advisory given  Plan Discussed with: CRNA and Surgeon  Anesthesia Plan Comments:         Anesthesia Quick Evaluation

## 2017-10-22 NOTE — Care Management CC44 (Signed)
Condition Code 44 Documentation Completed  Patient Details  Name: Sheri Butler MRN: [id number] Date of Birth: 06-30-1946   Condition Code 44 given:  Yes Patient signature on Condition Code 44 notice:  Yes Documentation of 2 MD's agreement:  Yes Code 44 added to claim:  Yes    Shelbie Ammons, RN 10/22/2017, 2:25 PM

## 2017-10-22 NOTE — OR Nursing (Signed)
1220 pm report to rachel ,rn on floor. S/p upper endoscopy and colonoscopy.Transported back to floor

## 2017-10-22 NOTE — Op Note (Signed)
[facility] Gastroenterology Patient Name: Sheri Butler Procedure Date: 10/22/2017 10:58 AM MRN: [id number] Account #: [id number] Date of Birth: [birth year] Admit Type: Inpatient Age: 71 Room: [facility] ENDO ROOM 3 Gender: Female Note Status: Finalized Procedure:            Colonoscopy Indications:          Hematochezia Providers:            Jonathon Bellows MD, MD Medicines:            Total IV Anesthesia (TIVA) Complications:        No immediate complications. Procedure:            Pre-Anesthesia Assessment:                       - Prior to the procedure, a History and Physical was                        performed, and patient medications, allergies and                        sensitivities were reviewed. The patient's tolerance of                        previous anesthesia was reviewed.                       - The risks and benefits of the procedure and the                        sedation options and risks were discussed with the                        patient. All questions were answered and informed                        consent was obtained.                       - ASA Grade Assessment: III - A patient with severe                        systemic disease.                       After obtaining informed consent, the colonoscope was                        passed under direct vision. Throughout the procedure,                        the patient's blood pressure, pulse, and oxygen                        saturations were monitored continuously. The                        Colonoscope was introduced through the anus and                        advanced to the the cecum, identified by the  appendiceal orifice, IC valve and transillumination.                        The colonoscopy was extremely difficult due to                        inadequate bowel prep and a tortuous colon. The patient                        tolerated the procedure. The quality of the  bowel                        preparation was poor. Findings:      A polypoid non-obstructing large mass was found in the rectum. The mass       was partially circumferential (involving one-half of the lumen       circumference). The mass measured four cm in length. In addition, its       diameter measured four mm. No bleeding was present. This was biopsied       with a cold forceps for histology.      A 25 mm polypoid lesion was found in the proximal ascending colon. The       lesion was multi-lobulated. No bleeding was present. No biopsies taken       to avoid scar tissue. Prep was inadequate to attempt EMR , in addition       was in a very difficult location in a very tortious colon      The exam was otherwise without abnormality. Impression:           - Preparation of the colon was poor.                       - Rule out malignancy, tumor in the rectum. Biopsied.                       - Polypoid lesion in the proximal ascending colon.                       - The examination was otherwise normal. Recommendation:       - Return patient to hospital ward for ongoing care.                       - Await pathology results.                       - 1. Likely will need EUS for the rectal lesion, at                        same time can do EUS for esophageal polyp.                       2. Depending on option chosen to treat the lesion in                        the rectum will have to decide option to resect the                        large polyp in the proximal ascending colon.                       -  Return to my office in 2 weeks. Procedure Code(s):    --- Professional ---                       [phone], Colonoscopy, flexible; with biopsy, single or                        multiple Diagnosis Code(s):    --- Professional ---                       D49.0, Neoplasm of unspecified behavior of digestive                        system                       K92.1, Melena (includes Hematochezia) CPT  copyright 2016 American Medical Association. All rights reserved. The codes documented in this report are preliminary and upon coder review may  be revised to meet current compliance requirements. Jonathon Bellows, MD Jonathon Bellows MD, MD 10/22/2017 11:35:06 AM This report has been signed electronically. Number of Addenda: 0 Note Initiated On: 10/22/2017 10:58 AM Scope Withdrawal Time: 0 hours 8 minutes 55 seconds  Total Procedure Duration: 0 hours 17 minutes 36 seconds       [facility]

## 2017-10-22 NOTE — Progress Notes (Signed)
Patient accepted back to room 134.  No complaints of pain.  Patient asking about being discharged.  Dr. Bridgett Larsson notified and will be discharging patient.

## 2017-10-22 NOTE — Progress Notes (Signed)
Discharge instructions and prescriptions given with verbalized understanding from patient.  Patient denies pain prior to discharge.  IV's removed per policy and procedure.  Patient taken out via wheelchair by nurse aid to be taken home in personal vehicle by daughter.

## 2017-10-22 NOTE — Discharge Instructions (Signed)
Heart healthy diet

## 2017-10-22 NOTE — Progress Notes (Signed)
Patient taken to Endo via bed by [facility], orderly.

## 2017-10-23 ENCOUNTER — Encounter: Payer: Self-pay | Admitting: Gastroenterology

## 2017-10-23 LAB — BPAM RBC
BLOOD PRODUCT EXPIRATION DATE: 201812052359
Blood Product Expiration Date: 201812052359
UNIT TYPE AND RH: 6200
Unit Type and Rh: 6200

## 2017-10-23 LAB — PREPARE RBC (CROSSMATCH)

## 2017-10-23 LAB — TYPE AND SCREEN
ABO/RH(D): A POS
ANTIBODY SCREEN: NEGATIVE
UNIT DIVISION: 0
Unit division: 0

## 2017-10-23 LAB — SURGICAL PATHOLOGY

## 2017-11-04 ENCOUNTER — Encounter: Payer: Self-pay | Admitting: Gastroenterology

## 2017-11-04 ENCOUNTER — Ambulatory Visit (INDEPENDENT_AMBULATORY_CARE_PROVIDER_SITE_OTHER): Payer: Medicare Other | Admitting: Gastroenterology

## 2017-11-04 VITALS — BP 116/74 | HR 83 | Temp 98.5°F | Ht 60.0 in | Wt 219.6 lb

## 2017-11-04 DIAGNOSIS — K228 Other specified diseases of esophagus: Secondary | ICD-10-CM

## 2017-11-04 DIAGNOSIS — K2281 Esophageal polyp: Secondary | ICD-10-CM

## 2017-11-04 DIAGNOSIS — K629 Disease of anus and rectum, unspecified: Secondary | ICD-10-CM

## 2017-11-04 DIAGNOSIS — K6289 Other specified diseases of anus and rectum: Secondary | ICD-10-CM

## 2017-11-04 NOTE — Progress Notes (Signed)
Jonathon Bellows MD, MRCP(U.K) [address]  [place]  [place], [place] [address]  Main: [phone]  Fax: [phone]   Primary Care Physician: Denton Lank, MD  Primary Gastroenterologist:  Dr. Jonathon Bellows   Chief Complaint  Patient presents with  . call back per dr. Vicente Males    HPI: Sheri Butler is a 71 y.o. female  Summary of history : She is here today for hospital follow-up she was admitted on 10/21/2017 with bright red blood per rectum.  She had a history of NSAID use.  On admission her hemoglobin was 11.5 g and she had a hemoglobin of 10.5 g a year back and MCV on admission of 97.6 creatinine of 0.61 and normal hepatic function panel.  She had been taking ibuprofen 1 every 5 hours for the past 2 months on and off for leg pains.  On admission she denied any abdominal pains.  The day prior to admission she had black colored stool with red blood and on the morning of admission the stool is getting lighter in color she denied any prior GI evaluation no family history of colon cancer or polyps.  On 10/22/2017 I performed an upper endoscopy which showed congestion erythema and aphthous ulcerations in the gastric antrum a 10 mm polyp but no bleeding was found in the lower third of the esophagus lesion appeared to be submucosal with normal overlying mucosa.  Biopsies of the antrum showed healing erosive gastritis negative H. pylori.  At the same time I also performed a colonoscopy I found a large polypoid nonobstructing mass in the rectum mass with circumferential involving about half of the lumen of the circumference measured about 4 cm in length diameter was also about 3-4 cm no bleeding was present biopsies were taken with a cold biopsy forceps.  In addition I found a 25 mm polypoid lesion in the proximal ascending colon lesion was multilobulated no bleeding was present no biopsies were taken to white scar tissue the prep was inadequate to attempt EMR and also in the very difficult  location in a very tortuous colon.  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   Interval history   10/22/2017-  11/04/2017   No rectal bleeding .    Current Outpatient Medications  Medication Sig Dispense Refill  . aspirin EC 81 MG tablet Take by mouth.    . potassium chloride SA (KLOR-CON M20) 20 MEQ tablet Take by mouth.    . quinapril (ACCUPRIL) 40 MG tablet Take by mouth.    Marland Kitchen acetaminophen (TYLENOL) 325 MG tablet Take 2 tablets (650 mg total) by mouth every 6 (six) hours as needed for mild pain (or Fever >/= 101). (Patient not taking: Reported on 10/20/2017)    . atenolol (TENORMIN) 25 MG tablet Take 25 mg by mouth daily.     Marland Kitchen losartan (COZAAR) 25 MG tablet Take 1 tablet daily by mouth.    . ondansetron (ZOFRAN) 4 MG tablet Take 1 tablet (4 mg total) by mouth daily as needed for nausea or vomiting. (Patient not taking: Reported on 10/20/2017) 20 tablet 1  . pantoprazole (PROTONIX) 40 MG tablet Take 1 tablet (40 mg total) daily by mouth. 14 tablet 0  . pravastatin (PRAVACHOL) 80 MG tablet Take 1 tablet by mouth daily.     No current facility-administered medications for this visit.     Allergies as of 11/04/2017 - Review Complete 11/04/2017  Allergen Reaction Noted  .  Sulfa antibiotics Anaphylaxis 12/14/2015    ROS:  General: Negative for anorexia, weight loss, fever, chills, fatigue, weakness. ENT: Negative for hoarseness, difficulty swallowing , nasal congestion. CV: Negative for chest pain, angina, palpitations, dyspnea on exertion, peripheral edema.  Respiratory: Negative for dyspnea at rest, dyspnea on exertion, cough, sputum, wheezing.  GI: See history of present illness. GU:  Negative for dysuria, hematuria, urinary incontinence, urinary frequency, nocturnal urination.  Endo: Negative for unusual weight change.    Physical Examination:   BP 116/74   Pulse 83   Temp 98.5 F (36.9 C)   Ht 5'  (1.524 m)   Wt 219 lb 9.6 oz (99.6 kg)   BMI 42.89 kg/m   General: Well-nourished, well-developed in no acute distress.  Eyes: No icterus. Conjunctivae pink. Mouth: Oropharyngeal mucosa moist and pink , no lesions erythema or exudate. Lungs: Clear to auscultation bilaterally. Non-labored. Heart: Regular rate and rhythm, no murmurs rubs or gallops.  Abdomen: Bowel sounds are normal, nontender, nondistended, no hepatosplenomegaly or masses, no abdominal bruits or hernia , no rebound or guarding.   Extremities: No lower extremity edema. No clubbing or deformities. Neuro: Alert and oriented x 3.  Grossly intact. Skin: Warm and dry, no jaundice.   Psych: Alert and cooperative, normal mood and affect.   Imaging Studies: No results found.  Assessment and Plan:   Sheri Butler is a 71 y.o. y/o female here to discuss results of her recent colonoscopy and EGD. EGD showed gastric ulcers- secondary to NSAID use, esophageal polyp which was a submucosal lesion. Large rectal mass , large polyp in ascending colon (not resected due to poor prep ). 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   Plan 1.  I will refer her for an urgent rectal ultrasound for the tubulovillous adenoma of the large rectal mass depending on the invasion further decision regarding either EMR versus ESD vs Trans anal resection  or surgical resection can be discussed.  She would need to be seen possibly by a colorectal surgeon.  If surgery is not required then she would require a EMR of the ascending colon lesion as well.  At the time of the rectal ultrasound she probably would require an EUS to evaluate the esophageal polyp/submucosal lesion.  2. No NSAID's   Dr Jonathon Bellows  MD,MRCP [facility]) Follow up in 6 weeks

## 2017-11-05 ENCOUNTER — Telehealth: Payer: Self-pay | Admitting: Gastroenterology

## 2017-11-05 NOTE — Telephone Encounter (Signed)
Left voice message for patient to call and schedule a 2 week follow up with Dr. Vicente Males

## 2017-11-17 ENCOUNTER — Encounter: Payer: Self-pay | Admitting: Gastroenterology

## 2017-11-17 ENCOUNTER — Ambulatory Visit: Payer: Medicare Other | Admitting: Gastroenterology

## 2017-11-17 ENCOUNTER — Other Ambulatory Visit: Payer: Self-pay

## 2018-03-19 ENCOUNTER — Emergency Department
Admission: EM | Admit: 2018-03-19 | Discharge: 2018-03-19 | Disposition: A | Discharge status: Other Acute Inpatient Hospital | Payer: Medicare Other | Attending: Emergency Medicine | Admitting: Emergency Medicine

## 2018-03-19 ENCOUNTER — Emergency Department: Payer: Medicare Other

## 2018-03-19 DIAGNOSIS — Z79899 Other long term (current) drug therapy: Secondary | ICD-10-CM | POA: Insufficient documentation

## 2018-03-19 DIAGNOSIS — I10 Essential (primary) hypertension: Secondary | ICD-10-CM | POA: Diagnosis not present

## 2018-03-19 DIAGNOSIS — Z9889 Other specified postprocedural states: Secondary | ICD-10-CM | POA: Diagnosis not present

## 2018-03-19 DIAGNOSIS — R112 Nausea with vomiting, unspecified: Secondary | ICD-10-CM | POA: Diagnosis not present

## 2018-03-19 DIAGNOSIS — R05 Cough: Secondary | ICD-10-CM | POA: Insufficient documentation

## 2018-03-19 DIAGNOSIS — R1010 Upper abdominal pain, unspecified: Secondary | ICD-10-CM | POA: Insufficient documentation

## 2018-03-19 DIAGNOSIS — E871 Hypo-osmolality and hyponatremia: Secondary | ICD-10-CM

## 2018-03-19 DIAGNOSIS — Z7982 Long term (current) use of aspirin: Secondary | ICD-10-CM | POA: Insufficient documentation

## 2018-03-19 LAB — COMPREHENSIVE METABOLIC PANEL
ALBUMIN: 3.2 g/dL — AB (ref 3.5–5.0)
ALK PHOS: 49 U/L (ref 38–126)
ALT: 14 U/L (ref 14–54)
AST: 26 U/L (ref 15–41)
Anion gap: 8 (ref 5–15)
BILIRUBIN TOTAL: 0.9 mg/dL (ref 0.3–1.2)
BUN: 7 mg/dL (ref 6–20)
CALCIUM: 8.3 mg/dL — AB (ref 8.9–10.3)
CO2: 25 mmol/L (ref 22–32)
Chloride: 92 mmol/L — ABNORMAL LOW (ref 101–111)
Creatinine, Ser: 0.58 mg/dL (ref 0.44–1.00)
GFR calc Af Amer: >60 mL/min (ref >60)
GFR calc non Af Amer: >60 mL/min (ref >60)
GLUCOSE: 111 mg/dL — AB (ref 65–99)
POTASSIUM: 3.8 mmol/L (ref 3.5–5.1)
SODIUM: 125 mmol/L — AB (ref 135–145)
TOTAL PROTEIN: 6.8 g/dL (ref 6.5–8.1)

## 2018-03-19 LAB — CBC
HEMATOCRIT: 34.5 % — AB (ref 35.0–47.0)
Hemoglobin: 11.3 g/dL — ABNORMAL LOW (ref 12.0–16.0)
MCH: 27.2 pg (ref 26.0–34.0)
MCHC: 32.6 g/dL (ref 32.0–36.0)
MCV: 83.4 fL (ref 80.0–100.0)
Platelets: 241 10*3/uL (ref 150–440)
RBC: 4.14 MIL/uL (ref 3.80–5.20)
RDW: 17.8 % — ABNORMAL HIGH (ref 11.5–14.5)
WBC: 3.8 10*3/uL (ref 3.6–11.0)

## 2018-03-19 LAB — TROPONIN I: Troponin I: <0.03 ng/mL (ref <0.03)

## 2018-03-19 LAB — LIPASE, BLOOD: Lipase: 35 U/L (ref 11–51)

## 2018-03-19 MED ORDER — METOPROLOL SUCCINATE ER 50 MG PO TB24
50.00 | ORAL_TABLET | ORAL | Status: DC
Start: 2018-03-19 — End: 2018-03-19

## 2018-03-19 MED ORDER — ACETAMINOPHEN 325 MG PO TABS
650.00 | ORAL_TABLET | ORAL | Status: DC
Start: 2018-03-19 — End: 2018-03-19

## 2018-03-19 MED ORDER — HYDRALAZINE HCL 20 MG/ML IJ SOLN
10.00 | INTRAMUSCULAR | Status: DC
Start: ? — End: 2018-03-19

## 2018-03-19 MED ORDER — GENERIC EXTERNAL MEDICATION
Status: DC
Start: ? — End: 2018-03-19

## 2018-03-19 MED ORDER — ONDANSETRON HCL 4 MG/2ML IJ SOLN
4.0000 mg | Freq: Once | INTRAMUSCULAR | Status: DC
  Filled 2018-03-19: qty 2

## 2018-03-19 MED ORDER — ONDANSETRON HCL 4 MG/2ML IJ SOLN
4.0000 mg | Freq: Once | INTRAMUSCULAR | Status: AC
  Administered 2018-03-19: 4 mg via INTRAVENOUS
  Filled 2018-03-19: qty 2

## 2018-03-19 MED ORDER — IOPAMIDOL (ISOVUE-300) INJECTION 61%
100.0000 mL | Freq: Once | INTRAVENOUS | Status: AC | PRN
  Administered 2018-03-19: 100 mL via INTRAVENOUS

## 2018-03-19 MED ORDER — SODIUM CHLORIDE 0.9 % IV BOLUS
250.0000 mL | Freq: Once | INTRAVENOUS | Status: AC
  Administered 2018-03-19: 250 mL via INTRAVENOUS

## 2018-03-19 MED ORDER — PHENOL 1.4 % MT LIQD
2.00 | OROMUCOSAL | Status: DC
Start: ? — End: 2018-03-19

## 2018-03-19 MED ORDER — TAMSULOSIN HCL 0.4 MG PO CAPS
0.40 | ORAL_CAPSULE | ORAL | Status: DC
Start: 2018-03-18 — End: 2018-03-19

## 2018-03-19 MED ORDER — GENERIC EXTERNAL MEDICATION
10.00 | Status: DC
Start: ? — End: 2018-03-19

## 2018-03-19 MED ORDER — DOCUSATE SODIUM 100 MG PO CAPS
100.00 | ORAL_CAPSULE | ORAL | Status: DC
Start: 2018-03-18 — End: 2018-03-19

## 2018-03-19 MED ORDER — BUPIVACAINE LIPOSOME IJ
1.00 | INTRAMUSCULAR | Status: DC
Start: ? — End: 2018-03-19

## 2018-03-19 MED ORDER — PROMETHAZINE HCL 25 MG/ML IJ SOLN
12.5000 mg | Freq: Once | INTRAMUSCULAR | Status: AC
  Administered 2018-03-19: 12.5 mg via INTRAVENOUS

## 2018-03-19 MED ORDER — FLUTICASONE PROPIONATE 50 MCG/ACT NA SUSP
2.00 | NASAL | Status: DC
Start: 2018-03-19 — End: 2018-03-19

## 2018-03-19 MED ORDER — MENTHOL 9.1 MG MT LOZG
1.00 | LOZENGE | OROMUCOSAL | Status: DC
Start: ? — End: 2018-03-19

## 2018-03-19 MED ORDER — IOPAMIDOL (ISOVUE-300) INJECTION 61%
15.0000 mL | INTRAVENOUS | Status: AC
Start: 2018-03-19 — End: 2018-03-19
  Administered 2018-03-19: 30 mL via ORAL

## 2018-03-19 MED ORDER — LOSARTAN POTASSIUM 50 MG PO TABS
25.00 | ORAL_TABLET | ORAL | Status: DC
Start: 2018-03-19 — End: 2018-03-19

## 2018-03-19 MED ORDER — ONDANSETRON HCL 4 MG/2ML IJ SOLN
4.0000 mg | Freq: Once | INTRAMUSCULAR | Status: AC
  Administered 2018-03-19: 4 mg via INTRAVENOUS

## 2018-03-19 MED ORDER — PRAVASTATIN SODIUM 20 MG PO TABS
10.00 | ORAL_TABLET | ORAL | Status: DC
Start: 2018-03-19 — End: 2018-03-19

## 2018-03-19 MED ORDER — PROMETHAZINE HCL 25 MG/ML IJ SOLN
INTRAMUSCULAR | Status: AC
  Administered 2018-03-19: 12.5 mg via INTRAVENOUS
  Filled 2018-03-19: qty 1

## 2018-03-19 MED ORDER — ALUMINUM-MAGNESIUM-SIMETHICONE 200-200-20 MG/5ML PO SUSP
30.00 | ORAL | Status: DC
Start: ? — End: 2018-03-19

## 2018-03-19 MED ORDER — ONDANSETRON HCL 4 MG/2ML IJ SOLN
4.0000 mg | Freq: Once | INTRAMUSCULAR | Status: DC
Start: 2018-03-19 — End: 2018-03-20
  Filled 2018-03-19: qty 2

## 2018-03-19 MED ORDER — HYDROMORPHONE HCL 1 MG/ML IJ SOLN
0.50 | INTRAMUSCULAR | Status: DC
Start: ? — End: 2018-03-19

## 2018-03-19 MED ORDER — TRAMADOL HCL 50 MG PO TABS
50.00 | ORAL_TABLET | ORAL | Status: DC
Start: 2018-03-19 — End: 2018-03-19

## 2018-03-19 MED ORDER — SODIUM CHLORIDE 0.9 % IV SOLN
50.00 | INTRAVENOUS | Status: DC
Start: ? — End: 2018-03-19

## 2018-03-19 MED ORDER — SALINE SPRAY 0.65 % NA SOLN
1.0000 | Freq: Once | NASAL | Status: AC
  Administered 2018-03-19: 1 via NASAL
  Filled 2018-03-19 (×2): qty 44

## 2018-03-19 MED ORDER — KETOROLAC TROMETHAMINE 15 MG/ML IJ SOLN
15.00 | INTRAMUSCULAR | Status: DC
Start: ? — End: 2018-03-19

## 2018-03-19 MED ORDER — KCL IN DEXTROSE-NACL 20-5-0.45 MEQ/L-%-% IV SOLN
100.00 | INTRAVENOUS | Status: DC
Start: ? — End: 2018-03-19

## 2018-03-19 NOTE — ED Notes (Signed)
Patient refused Zofran, stating it does not work and asking for something else.  Will seek [facility] for Phenergan 12.5mg  IV.

## 2018-03-19 NOTE — ED Triage Notes (Signed)
Patient had surgery on Monday to remove a part of her colon, per EMS.  Patient states she vomited x 3 this morning and is having some abdominal pain.  Patient's family called EMS.  Patient states, "My real problem is this mucous I've been coughing up, it's keeping me awake at night and really bothering me.  Patient reports upper abdominal pain.  Patient is in no obvious distress at this time.
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
## 2018-03-19 NOTE — ED Notes (Signed)
Transport here for pt to transfer to [facility].

## 2018-03-19 NOTE — ED Notes (Signed)
Pt repositioned in bed.

## 2018-03-19 NOTE — ED Notes (Signed)
Patient assisted to the toilet where she had a bowel movement and began having rectal bleeding.  MD notified of new change.

## 2018-03-19 NOTE — ED Notes (Addendum)
Pt repositioned - pt requesting to go home - explained to pt the seriousness of her condition and she agreed to stay
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
## 2018-03-19 NOTE — ED Notes (Signed)
Patient updated that it will be 45 more minutes before her transport arrives.

## 2018-03-19 NOTE — ED Notes (Signed)
Called transfer center at [facility], patient should be transferred after 1900

## 2018-03-24 ENCOUNTER — Emergency Department: Payer: Medicare Other

## 2018-03-24 ENCOUNTER — Other Ambulatory Visit: Payer: Self-pay

## 2018-03-24 ENCOUNTER — Encounter: Payer: Self-pay | Admitting: Emergency Medicine

## 2018-03-24 ENCOUNTER — Emergency Department
Admission: EM | Admit: 2018-03-24 | Discharge: 2018-03-24 | Disposition: A | Discharge status: Home / Self Care | Payer: Medicare Other | Attending: Emergency Medicine | Admitting: Emergency Medicine

## 2018-03-24 DIAGNOSIS — R197 Diarrhea, unspecified: Secondary | ICD-10-CM | POA: Insufficient documentation

## 2018-03-24 DIAGNOSIS — R112 Nausea with vomiting, unspecified: Secondary | ICD-10-CM | POA: Diagnosis not present

## 2018-03-24 DIAGNOSIS — K567 Ileus, unspecified: Secondary | ICD-10-CM

## 2018-03-24 DIAGNOSIS — R1110 Vomiting, unspecified: Secondary | ICD-10-CM

## 2018-03-24 DIAGNOSIS — I10 Essential (primary) hypertension: Secondary | ICD-10-CM | POA: Insufficient documentation

## 2018-03-24 LAB — CBC
HCT: 36.8 % (ref 35.0–47.0)
Hemoglobin: 12.1 g/dL (ref 12.0–16.0)
MCH: 28 pg (ref 26.0–34.0)
MCHC: 32.8 g/dL (ref 32.0–36.0)
MCV: 85.3 fL (ref 80.0–100.0)
Platelets: 286 10*3/uL (ref 150–440)
RBC: 4.32 MIL/uL (ref 3.80–5.20)
RDW: 19.2 % — ABNORMAL HIGH (ref 11.5–14.5)
WBC: 7.4 10*3/uL (ref 3.6–11.0)

## 2018-03-24 LAB — COMPREHENSIVE METABOLIC PANEL
ALK PHOS: 47 U/L (ref 38–126)
ALT: 8 U/L — ABNORMAL LOW (ref 14–54)
ANION GAP: 11 (ref 5–15)
AST: 22 U/L (ref 15–41)
Albumin: 2.7 g/dL — ABNORMAL LOW (ref 3.5–5.0)
BILIRUBIN TOTAL: 0.9 mg/dL (ref 0.3–1.2)
BUN: 6 mg/dL (ref 6–20)
CALCIUM: 8.1 mg/dL — AB (ref 8.9–10.3)
CO2: 22 mmol/L (ref 22–32)
Chloride: 104 mmol/L (ref 101–111)
Creatinine, Ser: 0.57 mg/dL (ref 0.44–1.00)
Glucose, Bld: 79 mg/dL (ref 65–99)
Potassium: 3.9 mmol/L (ref 3.5–5.1)
Sodium: 137 mmol/L (ref 135–145)
TOTAL PROTEIN: 5.9 g/dL — AB (ref 6.5–8.1)

## 2018-03-24 LAB — LIPASE, BLOOD: Lipase: 32 U/L (ref 11–51)

## 2018-03-24 MED ORDER — LOPERAMIDE HCL 2 MG PO CAPS
4.0000 mg | ORAL_CAPSULE | Freq: Once | ORAL | Status: AC
  Administered 2018-03-24: 4 mg via ORAL
  Filled 2018-03-24: qty 2

## 2018-03-24 MED ORDER — PROMETHAZINE HCL 25 MG PO TABS: 25.0000 mg | ORAL_TABLET | ORAL | 1 refills | Status: DC | PRN

## 2018-03-24 MED ORDER — SODIUM CHLORIDE 0.9 % IV SOLN: Freq: Once | INTRAVENOUS | Status: DC

## 2018-03-24 MED ORDER — DOCUSATE SODIUM 100 MG PO CAPS
100.00 | ORAL_CAPSULE | ORAL | Status: DC
Start: 2018-03-23 — End: 2018-03-24

## 2018-03-24 MED ORDER — SODIUM CHLORIDE 0.9 % IV SOLN
75.00 | INTRAVENOUS | Status: DC
Start: ? — End: 2018-03-24

## 2018-03-24 MED ORDER — GENERIC EXTERNAL MEDICATION
Status: DC
Start: ? — End: 2018-03-24

## 2018-03-24 MED ORDER — LOSARTAN POTASSIUM 50 MG PO TABS
25.00 | ORAL_TABLET | ORAL | Status: DC
Start: 2018-03-24 — End: 2018-03-24

## 2018-03-24 MED ORDER — ENOXAPARIN SODIUM 40 MG/0.4ML ~~LOC~~ SOLN
40.00 | SUBCUTANEOUS | Status: DC
Start: 2018-03-24 — End: 2018-03-24

## 2018-03-24 MED ORDER — PRAVASTATIN SODIUM 20 MG PO TABS
10.00 | ORAL_TABLET | ORAL | Status: DC
Start: 2018-03-24 — End: 2018-03-24

## 2018-03-24 MED ORDER — MUCINEX DM 30-600 MG PO TB12
1.00 | ORAL_TABLET | ORAL | Status: DC
Start: 2018-03-23 — End: 2018-03-24

## 2018-03-24 MED ORDER — ONDANSETRON 4 MG PO TBDP
4.0000 mg | ORAL_TABLET | Freq: Once | ORAL | Status: AC
  Administered 2018-03-24: 4 mg via ORAL
  Filled 2018-03-24: qty 1

## 2018-03-24 MED ORDER — NYSTATIN 100000 UNIT/GM EX POWD
1.00 | CUTANEOUS | Status: DC
Start: 2018-03-23 — End: 2018-03-24

## 2018-03-24 MED ORDER — METOPROLOL SUCCINATE ER 50 MG PO TB24
50.00 | ORAL_TABLET | ORAL | Status: DC
Start: 2018-03-24 — End: 2018-03-24

## 2018-03-24 MED ORDER — FLUTICASONE PROPIONATE 50 MCG/ACT NA SUSP
1.00 | NASAL | Status: DC
Start: 2018-03-24 — End: 2018-03-24

## 2018-03-24 MED ORDER — ALUMINUM-MAGNESIUM-SIMETHICONE 200-200-20 MG/5ML PO SUSP
30.00 | ORAL | Status: DC
Start: ? — End: 2018-03-24

## 2018-03-24 MED ORDER — LACTINEX PO PACK
1.00 | PACK | ORAL | Status: DC
Start: 2018-03-23 — End: 2018-03-24

## 2018-03-24 NOTE — ED Notes (Addendum)
Pt assisted to toilet. Hat placed to collect urine sample. Pt missed hat d/t defecating.

## 2018-03-24 NOTE — ED Notes (Signed)
Pt ambulatory to toilet and back. Pt calling daughter in attempt to get daughter to pick pt up for ride. Pt kept water down.

## 2018-03-24 NOTE — ED Notes (Signed)
Pt taken to xray 

## 2018-03-24 NOTE — ED Notes (Signed)
Pt wheeled to room. Eating chips upon arrival to room. No distress noted.

## 2018-03-24 NOTE — ED Notes (Signed)
Stuck pt twice for IV. Unsuccessful. Asking another RN to take a look.

## 2018-03-24 NOTE — ED Notes (Signed)
IV team unsuccessful at IV attempt. Dr. Jimmye Norman notified. Per Dr. Jimmye Norman ok to give water for pt to drink at this time instead of IVF d/t no access at this time.

## 2018-03-24 NOTE — ED Notes (Addendum)
Pt states N&V&D x few days. Saw PCP yesterday and sent home. States is keeping food down, states no emesis today. Pt had BM while in bathroom attempting to collect urine. No distress noted. Alert, oriented. Denies being around anyone who has been sick recently. Pt asking for a remote. cheetos bag noted to be empty inside pt emesis bag.

## 2018-03-24 NOTE — ED Notes (Signed)
Called daughter Carleene Overlie on work phone, was told she is on a break. Called cell phone, no answer and voicemail not set up. Will call back in a few minutes.   Work [phone]  Cell [phone]

## 2018-03-24 NOTE — ED Triage Notes (Signed)
Here for NVD since early this week per pt.  Denies pain. No fevers. Appears well at this time. VSS.  Color WNL.

## 2018-03-24 NOTE — ED Provider Notes (Signed)
[facility] Emergency Department Provider Note       Time seen: ----------------------------------------- 12:22 PM on 03/24/2018 -----------------------------------------   I have reviewed the triage vital signs and the nursing notes.  HISTORY   Chief Complaint Emesis    HPI Sheri Butler is a 72 y.o. female with a history of hyperlipidemia and hypertension who presents to the ED for nausea, vomiting and diarrhea since early this week according to her.  She states she was just discharged from [facility] yesterday after recent small bowel obstruction which was reportedly repaired.  She presents eating a bag of chips.  She has not taken anything for vomiting or diarrhea.  Past Medical History:  Diagnosis Date  . Hyperlipidemia   . Hypertension     Patient Active Problem List   Diagnosis Date Noted  . GI bleed 10/21/2017  . Protein-calorie malnutrition, severe 11/10/2015  . Acute kidney injury ([place]) 11/08/2015    Past Surgical History:  Procedure Laterality Date  . COLONOSCOPY WITH PROPOFOL N/A 10/22/2017   Procedure: COLONOSCOPY WITH PROPOFOL;  Surgeon: Jonathon Bellows, MD;  Location: [facility] ENDOSCOPY;  Service: Gastroenterology;  Laterality: N/A;  . ESOPHAGOGASTRODUODENOSCOPY (EGD) WITH PROPOFOL N/A 10/22/2017   Procedure: ESOPHAGOGASTRODUODENOSCOPY (EGD) WITH PROPOFOL;  Surgeon: Jonathon Bellows, MD;  Location: [facility] ENDOSCOPY;  Service: Gastroenterology;  Laterality: N/A;  . none      Allergies Sulfa antibiotics  Social History Social History   Tobacco Use  . Smoking status: Never Smoker  . Smokeless tobacco: Never Used  Substance Use Topics  . Alcohol use: Yes    Alcohol/week: 8.4 oz    Types: 14 Cans of beer per week    Comment: everyday  . Drug use: No   Review of Systems Constitutional: Negative for fever. Cardiovascular: Negative for chest pain. Respiratory: Negative for shortness of breath. Gastrointestinal: Negative for abdominal  pain, positive for vomiting and diarrhea Musculoskeletal: Negative for back pain. Skin: Negative for rash. Neurological: Negative for headaches, focal weakness or numbness.  All systems negative/normal/unremarkable except as stated in the HPI  ____________________________________________   PHYSICAL EXAM:  VITAL SIGNS: ED Triage Vitals  Enc Vitals Group     BP 03/24/18 1027 104/69     Pulse Rate 03/24/18 1027 94     Resp 03/24/18 1027 16     Temp 03/24/18 1027 99.5 F (37.5 C)     Temp Source 03/24/18 1027 Oral     SpO2 03/24/18 1027 100 %     Weight 03/24/18 1028 199 lb (90.3 kg)     Height 03/24/18 1028 5' (1.524 m)     Head Circumference --      Peak Flow --      Pain Score 03/24/18 1027 0     Pain Loc --      Pain Edu? --      Excl. in [place]? --    Constitutional: Alert and oriented. Well appearing and in no distress. Eyes: Conjunctivae are normal. Normal extraocular movements. ENT   Head: Normocephalic and atraumatic.   Nose: No congestion/rhinnorhea.   Mouth/Throat: Mucous membranes are moist.   Neck: No stridor. Cardiovascular: Normal rate, regular rhythm. No murmurs, rubs, or gallops. Respiratory: Normal respiratory effort without tachypnea nor retractions. Breath sounds are clear and equal bilaterally. No wheezes/rales/rhonchi. Gastrointestinal: Soft and nontender. Normal bowel sounds Musculoskeletal: Nontender with normal range of motion in extremities. No lower extremity tenderness nor edema. Neurologic:  Normal speech and language. No gross focal neurologic deficits are appreciated.  Skin:  Skin is warm, dry and intact. No rash noted. Psychiatric: Mood and affect are normal. Speech and behavior are normal.  ____________________________________________  ED COURSE:  As part of my medical decision making, I reviewed the following data within the [facility] History obtained from family if available, nursing notes, old chart and ekg, as  well as notes from prior ED visits. Patient presented for vomiting and diarrhea, we will assess with labs and imaging as indicated at this time. Clinical Course as of Mar 24 1442  Wed Mar 24, 2018  1403 I spoke with her colorectal surgeon Dr. Truddie Hidden who feels this is clearly an ileus, recommended fluids and discharge. She has tolerated po well here.    [JW]    Clinical Course User Index [JW] Earleen Newport, MD   Procedures ____________________________________________   LABS (pertinent positives/negatives)  Labs Reviewed  COMPREHENSIVE METABOLIC PANEL - Abnormal; Notable for the following components:      Result Value   Calcium 8.1 (*)    Total Protein 5.9 (*)    Albumin 2.7 (*)    ALT 8 (*)    All other components within normal limits  CBC - Abnormal; Notable for the following components:   RDW 19.2 (*)    All other components within normal limits  LIPASE, BLOOD  URINALYSIS, COMPLETE (UACMP) WITH MICROSCOPIC    RADIOLOGY Images were viewed by me  Abdomen 2 view IMPRESSION: Findings suggestive of partial small bowel obstruction. It is unclear if there is interval stability or progression compared with prior CT.   ____________________________________________  DIFFERENTIAL DIAGNOSIS   Gastroenteritis, dehydration, electrolyte abnormality, obstruction unlikely  FINAL ASSESSMENT AND PLAN  Vomiting and diarrhea   Plan: The patient had presented for vomiting and diarrhea. Patient's labs are reassuring. Patient's imaging was concerning for partial small bowel obstruction but I discussed this with her surgeon as dictated above.  She has a long history of this and has consistently been proven to have an ileus.  She is tolerating liquids and solids well here.  I will discharge her with antiemetics.   Laurence Aly, MD   Note: This note was generated in part or whole with voice recognition software. Voice recognition is usually quite accurate but  there are transcription errors that can and very often do occur. I apologize for any typographical errors that were not detected and corrected.     Earleen Newport, MD 03/24/18 [phone]

## 2020-12-21 ENCOUNTER — Emergency Department: Payer: Medicare Other

## 2020-12-21 ENCOUNTER — Inpatient Hospital Stay
Admission: EM | Admit: 2020-12-21 | Discharge: 2020-12-24 | DRG: 871 | Disposition: A | Discharge status: Home Health | Payer: Medicare Other | Attending: Internal Medicine | Admitting: Internal Medicine

## 2020-12-21 ENCOUNTER — Other Ambulatory Visit: Payer: Self-pay

## 2020-12-21 DIAGNOSIS — A4189 Other specified sepsis: Principal | ICD-10-CM | POA: Diagnosis present

## 2020-12-21 DIAGNOSIS — Z79899 Other long term (current) drug therapy: Secondary | ICD-10-CM

## 2020-12-21 DIAGNOSIS — I10 Essential (primary) hypertension: Secondary | ICD-10-CM | POA: Diagnosis present

## 2020-12-21 DIAGNOSIS — J1282 Pneumonia due to coronavirus disease 2019: Secondary | ICD-10-CM | POA: Diagnosis present

## 2020-12-21 DIAGNOSIS — J9811 Atelectasis: Secondary | ICD-10-CM | POA: Diagnosis present

## 2020-12-21 DIAGNOSIS — G9349 Other encephalopathy: Secondary | ICD-10-CM | POA: Diagnosis present

## 2020-12-21 DIAGNOSIS — Z7982 Long term (current) use of aspirin: Secondary | ICD-10-CM

## 2020-12-21 DIAGNOSIS — E785 Hyperlipidemia, unspecified: Secondary | ICD-10-CM | POA: Diagnosis present

## 2020-12-21 DIAGNOSIS — I160 Hypertensive urgency: Secondary | ICD-10-CM | POA: Diagnosis present

## 2020-12-21 DIAGNOSIS — Z882 Allergy status to sulfonamides status: Secondary | ICD-10-CM

## 2020-12-21 DIAGNOSIS — N179 Acute kidney failure, unspecified: Secondary | ICD-10-CM

## 2020-12-21 DIAGNOSIS — U071 COVID-19: Secondary | ICD-10-CM | POA: Diagnosis present

## 2020-12-21 DIAGNOSIS — E860 Dehydration: Secondary | ICD-10-CM | POA: Diagnosis present

## 2020-12-21 DIAGNOSIS — N390 Urinary tract infection, site not specified: Secondary | ICD-10-CM | POA: Diagnosis present

## 2020-12-21 DIAGNOSIS — Z8249 Family history of ischemic heart disease and other diseases of the circulatory system: Secondary | ICD-10-CM

## 2020-12-21 DIAGNOSIS — R6520 Severe sepsis without septic shock: Secondary | ICD-10-CM | POA: Diagnosis present

## 2020-12-21 DIAGNOSIS — A419 Sepsis, unspecified organism: Secondary | ICD-10-CM

## 2020-12-21 DIAGNOSIS — D649 Anemia, unspecified: Secondary | ICD-10-CM | POA: Diagnosis present

## 2020-12-21 LAB — CBC WITH DIFFERENTIAL/PLATELET
Abs Immature Granulocytes: 0.09 10*3/uL — ABNORMAL HIGH (ref 0.00–0.07)
Basophils Absolute: 0 10*3/uL (ref 0.0–0.1)
Basophils Relative: 0 %
Eosinophils Absolute: 0 10*3/uL (ref 0.0–0.5)
Eosinophils Relative: 0 %
HCT: 29.5 % — ABNORMAL LOW (ref 36.0–46.0)
Hemoglobin: 10.1 g/dL — ABNORMAL LOW (ref 12.0–15.0)
Immature Granulocytes: 1 %
Lymphocytes Relative: 9 %
Lymphs Abs: 0.6 10*3/uL — ABNORMAL LOW (ref 0.7–4.0)
MCH: 32.3 pg (ref 26.0–34.0)
MCHC: 34.2 g/dL (ref 30.0–36.0)
MCV: 94.2 fL (ref 80.0–100.0)
Monocytes Absolute: 0.7 10*3/uL (ref 0.1–1.0)
Monocytes Relative: 10 %
Neutro Abs: 5.7 10*3/uL (ref 1.7–7.7)
Neutrophils Relative %: 80 %
Platelets: 100 10*3/uL — ABNORMAL LOW (ref 150–400)
RBC: 3.13 MIL/uL — ABNORMAL LOW (ref 3.87–5.11)
RDW: 14.4 % (ref 11.5–15.5)
Smear Review: NORMAL
WBC: 7.2 10*3/uL (ref 4.0–10.5)
nRBC: 0.3 % — ABNORMAL HIGH (ref 0.0–0.2)

## 2020-12-21 LAB — COMPREHENSIVE METABOLIC PANEL
ALT: 35 U/L (ref 0–44)
AST: 102 U/L — ABNORMAL HIGH (ref 15–41)
Albumin: 3.6 g/dL (ref 3.5–5.0)
Alkaline Phosphatase: 55 U/L (ref 38–126)
Anion gap: 19 — ABNORMAL HIGH (ref 5–15)
BUN: 30 mg/dL — ABNORMAL HIGH (ref 8–23)
CO2: 20 mmol/L — ABNORMAL LOW (ref 22–32)
Calcium: 9.4 mg/dL (ref 8.9–10.3)
Chloride: 98 mmol/L (ref 98–111)
Creatinine, Ser: 1.81 mg/dL — ABNORMAL HIGH (ref 0.44–1.00)
GFR, Estimated: 29 mL/min — ABNORMAL LOW (ref >60)
Glucose, Bld: 128 mg/dL — ABNORMAL HIGH (ref 70–99)
Potassium: 4.4 mmol/L (ref 3.5–5.1)
Sodium: 137 mmol/L (ref 135–145)
Total Bilirubin: 1 mg/dL (ref 0.3–1.2)
Total Protein: 8 g/dL (ref 6.5–8.1)

## 2020-12-21 LAB — PROTIME-INR
INR: 1 (ref 0.8–1.2)
Prothrombin Time: 12.8 seconds (ref 11.4–15.2)

## 2020-12-21 LAB — LACTIC ACID, PLASMA
Lactic Acid, Venous: 3.3 mmol/L (ref 0.5–1.9)
Lactic Acid, Venous: 2.4 mmol/L (ref 0.5–1.9)

## 2020-12-21 MED ORDER — SODIUM CHLORIDE 0.9 % IV SOLN
100.0000 mg | Freq: Every day | INTRAVENOUS | Status: DC
  Administered 2020-12-23 – 2020-12-24 (×2): 100 mg via INTRAVENOUS
  Filled 2020-12-21 (×2): qty 20

## 2020-12-21 MED ORDER — DEXAMETHASONE SODIUM PHOSPHATE 10 MG/ML IJ SOLN: 10.0000 mg | Freq: Once | INTRAMUSCULAR | Status: DC

## 2020-12-21 MED ORDER — SODIUM CHLORIDE 0.9 % IV BOLUS
1000.0000 mL | Freq: Once | INTRAVENOUS | Status: AC
  Administered 2020-12-21: 1000 mL via INTRAVENOUS

## 2020-12-21 MED ORDER — SODIUM CHLORIDE 0.9 % IV SOLN
200.0000 mg | Freq: Once | INTRAVENOUS | Status: AC
  Administered 2020-12-22: 200 mg via INTRAVENOUS
  Filled 2020-12-21: qty 200

## 2020-12-21 MED ORDER — ACETAMINOPHEN 325 MG PO TABS
650.0000 mg | ORAL_TABLET | Freq: Once | ORAL | Status: AC | PRN
  Administered 2020-12-21: 650 mg via ORAL
  Filled 2020-12-21: qty 2

## 2020-12-21 MED ORDER — SODIUM CHLORIDE 0.9 % IV SOLN
1.0000 g | Freq: Once | INTRAVENOUS | Status: AC
  Administered 2020-12-21: 1 g via INTRAVENOUS
  Filled 2020-12-21: qty 10

## 2020-12-21 NOTE — Progress Notes (Signed)
Remdesivir - Pharmacy Brief Note   A/P:  Remdesivir 200 mg IVPB once followed by 100 mg IVPB daily x 4 days.   Hart Robinsons, PharmD Clinical Pharmacist  12/21/2020 11:53 PM

## 2020-12-21 NOTE — ED Notes (Signed)
Per EMS pt is usually alert and oriented X4 at present only oriented to self.  CBG 122 100.59F 128HR 113/73 100%RA

## 2020-12-21 NOTE — ED Notes (Signed)
Sheri Butler in lab notified of need for repeat venipuncture assist with repeat lactic acid. Pt to ct scan.

## 2020-12-21 NOTE — ED Triage Notes (Signed)
Pt with fever, not able to tell RN why she is here. Skin hot to touch, dawn, rn charge notified of code sepsis.

## 2020-12-21 NOTE — ED Notes (Signed)
Lactic Acid 3.3

## 2020-12-21 NOTE — ED Notes (Signed)
Lab here for venipuncture.  

## 2020-12-21 NOTE — ED Provider Notes (Signed)
[facility] Emergency Department Provider Note  ____________________________________________  Time seen: Approximately 10:34 PM  I have reviewed the triage vital signs and the nursing notes.   HISTORY  Chief Complaint Fever and Code Sepsis    Level 5 Caveat: Portions of the History and Physical including HPI and review of systems are unable to be completely obtained due to patient being a poor historian   HPI Sheri Butler is a 75 y.o. female with a history of hypertension hyperlipidemia who is brought to the ED tonight by her children.  Patient unable to state why she is here, states that she feels fine, says that her children "worry about everything."  She denies any pain, chest pain, shortness of breath, cough, body aches, fatigue.  States that she has not been eating well because she has not had a sense of taste for the last several months.  Denies loss of sense of smell.  Denies vomiting or diarrhea.      Past Medical History:  Diagnosis Date  . Hyperlipidemia   . Hypertension      Patient Active Problem List   Diagnosis Date Noted  . GI bleed 10/21/2017  . Protein-calorie malnutrition, severe 11/10/2015  . Acute kidney injury ([place]) 11/08/2015     Past Surgical History:  Procedure Laterality Date  . COLONOSCOPY WITH PROPOFOL N/A 10/22/2017   Procedure: COLONOSCOPY WITH PROPOFOL;  Surgeon: Jonathon Bellows, MD;  Location: [facility] ENDOSCOPY;  Service: Gastroenterology;  Laterality: N/A;  . ESOPHAGOGASTRODUODENOSCOPY (EGD) WITH PROPOFOL N/A 10/22/2017   Procedure: ESOPHAGOGASTRODUODENOSCOPY (EGD) WITH PROPOFOL;  Surgeon: Jonathon Bellows, MD;  Location: [facility] ENDOSCOPY;  Service: Gastroenterology;  Laterality: N/A;  . none       Prior to Admission medications   Medication Sig Start Date End Date Taking? Authorizing Provider  acetaminophen (TYLENOL) 325 MG tablet Take 2 tablets (650 mg total) by mouth every 6 (six) hours as needed for mild pain (or  Fever >/= 101). 11/12/15   Nicholes Mango, MD  aspirin EC 81 MG tablet Take 81 mg by mouth daily.  04/30/09   [provider]  atenolol (TENORMIN) 25 MG tablet Take 25 mg by mouth daily.  10/19/15   [provider]  IBU 800 MG tablet Take 800 mg by mouth every 6 (six) hours as needed.  11/13/17   [provider]  losartan (COZAAR) 25 MG tablet Take 1 tablet daily by mouth. 08/03/17   [provider]  pravastatin (PRAVACHOL) 80 MG tablet Take 1 tablet by mouth daily. 10/19/15   [provider]  promethazine (PHENERGAN) 25 MG tablet Take 1 tablet (25 mg total) by mouth every 4 (four) hours as needed for nausea or vomiting. 03/24/18   Earleen Newport, MD  tamsulosin (FLOMAX) 0.4 MG CAPS capsule Take 0.4 mg by mouth daily.    [provider]     Allergies Sulfa antibiotics   Family History  Problem Relation Age of Onset  . Hypertension Other   . CAD Neg Hx   . Diabetes Mellitus II Neg Hx     Social History Social History   Tobacco Use  . Smoking status: Never Smoker  . Smokeless tobacco: Never Used  Substance Use Topics  . Alcohol use: Yes    Alcohol/week: 14.0 standard drinks    Types: 14 Cans of beer per week    Comment: everyday  . Drug use: No    Review of Systems Level 5 Caveat: Portions of the History and Physical  including HPI and review of systems are unable to be completely obtained due to patient being a poor historian   Constitutional:   Positive fever.  ENT:   No rhinorrhea. Cardiovascular:   No chest pain or syncope. Respiratory:   No dyspnea or cough. Gastrointestinal:   Negative for abdominal pain, vomiting and diarrhea.  Musculoskeletal:   Negative for focal pain or swelling ____________________________________________   PHYSICAL EXAM:  VITAL SIGNS: ED Triage Vitals  Enc Vitals Group     BP 12/21/20 2021 (!) 206/101     Pulse Rate 12/21/20 2021 (!) 147     Resp 12/21/20 2021 (!) 26     Temp  12/21/20 2021 (!) 102.1 F (38.9 C)     Temp Source 12/21/20 2021 Oral     SpO2 12/21/20 2021 100 %     Weight 12/21/20 2022 160 lb (72.6 kg)     Height 12/21/20 2022 5' (1.524 m)     Head Circumference --      Peak Flow --      Pain Score --      Pain Loc --      Pain Edu? --      Excl. in [place]? --     Vital signs reviewed, nursing assessments reviewed.   Constitutional:   Alert and oriented to person and place. Non-toxic appearance. Eyes:   Conjunctivae are normal. EOMI. PERRL. ENT      Head:   Normocephalic and atraumatic.      Nose:   No congestion/rhinnorhea.       Mouth/Throat:   Dry mucous membranes, no pharyngeal erythema. No peritonsillar mass.       Neck:   No meningismus. Full ROM. Hematological/Lymphatic/Immunilogical:   No cervical lymphadenopathy. Cardiovascular:   Tachycardia heart rate 110. Symmetric bilateral radial and DP pulses.  No murmurs. Cap refill less than 2 seconds. Respiratory:   Tachypnea.  No wheezing or crackles.  Symmetric air movement. Gastrointestinal:   Soft with mid abdominal tenderness. Non distended.   No rebound, rigidity, or guarding.  Musculoskeletal:   Normal range of motion in all extremities. No joint effusions.  No lower extremity tenderness.  No edema. Neurologic:   Normal speech and language.  Motor grossly intact. No acute focal neurologic deficits are appreciated.  Skin:    Skin is warm, dry and intact. No rash noted.  No petechiae, purpura, or bullae.  ____________________________________________    LABS (pertinent positives/negatives) (all labs ordered are listed, but only abnormal results are displayed) Labs Reviewed  COMPREHENSIVE METABOLIC PANEL - Abnormal; Notable for the following components:      Result Value   CO2 20 (*)    Glucose, Bld 128 (*)    BUN 30 (*)    Creatinine, Ser 1.81 (*)    AST 102 (*)    GFR, Estimated 29 (*)    Anion gap 19 (*)    All other components within normal limits  LACTIC ACID, PLASMA -  Abnormal; Notable for the following components:   Lactic Acid, Venous 3.3 (*)    All other components within normal limits  CBC WITH DIFFERENTIAL/PLATELET - Abnormal; Notable for the following components:   RBC 3.13 (*)    Hemoglobin 10.1 (*)    HCT 29.5 (*)    Platelets 100 (*)    nRBC 0.3 (*)    Lymphs Abs 0.6 (*)    Abs Immature Granulocytes 0.09 (*)    All other components within normal limits  CULTURE,  BLOOD (ROUTINE X 2)  CULTURE, BLOOD (ROUTINE X 2)  PROTIME-INR  LACTIC ACID, PLASMA  URINALYSIS, COMPLETE (UACMP) WITH MICROSCOPIC  POC SARS CORONAVIRUS 2 AG -  ED   ____________________________________________   EKG Interpreted by me Sinus tachycardia rate 129.  Normal axis and intervals.  Normal QRS and ST segments.  T waves unremarkable.   ____________________________________________    RADIOLOGY  DG Chest 1 View  Result Date: 12/21/2020 CLINICAL DATA:  Fever. EXAM: CHEST  1 VIEW COMPARISON:  None. FINDINGS: Very mild linear atelectasis is seen within the lateral aspect of the left lung base. There is no evidence of an acute infiltrate, pleural effusion or pneumothorax. The cardiac silhouette is mildly enlarged. Mild prominence of the right hilar region is seen. Degenerative changes are seen within the mid and lower thoracic spine. IMPRESSION: 1. Very mild left basilar linear atelectasis. 2. Mild right hilar prominence which may be vascular in origin. Correlation with nonemergent chest CT is recommended. Electronically Signed   By: Virgina Norfolk M.D.   On: 12/21/2020 21:38    ____________________________________________   PROCEDURES Procedures  ____________________________________________  DIFFERENTIAL DIAGNOSIS   Sepsis, UTI, pneumonia, COVID, ureteral obstruction, diverticulitis, appendicitis, biliary disease, pancreatitis  CLINICAL IMPRESSION / ASSESSMENT AND PLAN / ED COURSE  Medications ordered in the ED: Medications  cefTRIAXone (ROCEPHIN) 1 g in  sodium chloride 0.9 % 100 mL IVPB (1 g Intravenous New Bag/Given 12/21/20 2229)  acetaminophen (TYLENOL) tablet 650 mg (650 mg Oral Given 12/21/20 2032)  sodium chloride 0.9 % bolus 1,000 mL (1,000 mLs Intravenous New Bag/Given 12/21/20 2229)    Pertinent labs & imaging results that were available during my care of the patient were reviewed by me and considered in my medical decision making (see chart for details).   Sheri Butler was evaluated in Emergency Department on 12/21/2020 for the symptoms described in the history of present illness. She was evaluated in the context of the global COVID-19 pandemic, which necessitated consideration that the patient might be at risk for infection with the SARS-CoV-2 virus that causes COVID-19. Institutional protocols and algorithms that pertain to the evaluation of patients at risk for COVID-19 are in a state of rapid change based on information released by regulatory bodies including the CDC and federal and state organizations. These policies and algorithms were followed during the patient's care in the ED.   Patient brought to ED due to fever.  Not eating well, clinically appears dehydrated.  She is febrile, tachycardic, tachypneic.  Blood pressure is normal, no signs of shock.  Sepsis work-up initiated in triage, which shows AKI with a creatinine of 1.8, elevated anion gap metabolic acidosis, likely due to lactic acidosis with a lactate of 3.3.  CBC panel was unremarkable.  Chest x-ray viewed and interpreted by me, appears unremarkable.  Radiology report confirms no acute findings, no infectious infiltrates.  With abdominal tenderness, I suspect most likely UTI versus COVID.  Will obtain COVID swab, give a dose of Rocephin, give a fluid bolus for hydration.  Anticipate admission.  Clinical Course as of 12/21/20 2323  Ludwig Clarks Dec 21, 2020  2307 CT negative for abdominal pathology but shows GGOs in lung bases concerning for covid pneumonia. Pt is not hypoxic  [PS]  2323 Covid test is positive. Decadron and remdesivir ordered. [PS]    Clinical Course User Index [PS] Carrie Mew, MD     ____________________________________________   FINAL CLINICAL IMPRESSION(S) / ED DIAGNOSES    Final diagnoses:  AKI (acute kidney  injury) [facility])     ED Discharge Orders    None      Portions of this note were generated with dragon dictation software. Dictation errors may occur despite best attempts at proofreading.   Carrie Mew, MD 12/21/20 2239

## 2020-12-21 NOTE — ED Notes (Signed)
Spoke with corey in lab regarding need for venipuncture assist for code sepsis, awaiting iv team for placement of iv. Attempted x1 without success.

## 2020-12-22 ENCOUNTER — Other Ambulatory Visit: Payer: Self-pay

## 2020-12-22 ENCOUNTER — Encounter: Payer: Self-pay | Admitting: Family Medicine

## 2020-12-22 DIAGNOSIS — U071 COVID-19: Secondary | ICD-10-CM | POA: Diagnosis present

## 2020-12-22 DIAGNOSIS — N179 Acute kidney failure, unspecified: Secondary | ICD-10-CM | POA: Diagnosis present

## 2020-12-22 DIAGNOSIS — Z8249 Family history of ischemic heart disease and other diseases of the circulatory system: Secondary | ICD-10-CM | POA: Diagnosis not present

## 2020-12-22 DIAGNOSIS — A4189 Other specified sepsis: Principal | ICD-10-CM

## 2020-12-22 DIAGNOSIS — R6520 Severe sepsis without septic shock: Secondary | ICD-10-CM

## 2020-12-22 DIAGNOSIS — Z7982 Long term (current) use of aspirin: Secondary | ICD-10-CM | POA: Diagnosis not present

## 2020-12-22 DIAGNOSIS — D649 Anemia, unspecified: Secondary | ICD-10-CM | POA: Diagnosis present

## 2020-12-22 DIAGNOSIS — E860 Dehydration: Secondary | ICD-10-CM | POA: Diagnosis present

## 2020-12-22 DIAGNOSIS — I160 Hypertensive urgency: Secondary | ICD-10-CM

## 2020-12-22 DIAGNOSIS — Z882 Allergy status to sulfonamides status: Secondary | ICD-10-CM | POA: Diagnosis not present

## 2020-12-22 DIAGNOSIS — A419 Sepsis, unspecified organism: Secondary | ICD-10-CM | POA: Diagnosis not present

## 2020-12-22 DIAGNOSIS — J1282 Pneumonia due to coronavirus disease 2019: Secondary | ICD-10-CM | POA: Diagnosis present

## 2020-12-22 DIAGNOSIS — G9349 Other encephalopathy: Secondary | ICD-10-CM

## 2020-12-22 DIAGNOSIS — Z79899 Other long term (current) drug therapy: Secondary | ICD-10-CM | POA: Diagnosis not present

## 2020-12-22 DIAGNOSIS — E785 Hyperlipidemia, unspecified: Secondary | ICD-10-CM | POA: Diagnosis present

## 2020-12-22 DIAGNOSIS — I10 Essential (primary) hypertension: Secondary | ICD-10-CM | POA: Diagnosis present

## 2020-12-22 DIAGNOSIS — J9811 Atelectasis: Secondary | ICD-10-CM | POA: Diagnosis present

## 2020-12-22 DIAGNOSIS — N390 Urinary tract infection, site not specified: Secondary | ICD-10-CM | POA: Diagnosis present

## 2020-12-22 LAB — URINALYSIS, COMPLETE (UACMP) WITH MICROSCOPIC
Bilirubin Urine: NEGATIVE
Glucose, UA: NEGATIVE mg/dL
Ketones, ur: NEGATIVE mg/dL
Nitrite: POSITIVE — AB
Protein, ur: 30 mg/dL — AB
Specific Gravity, Urine: 1.016 (ref 1.005–1.030)
pH: 5 (ref 5.0–8.0)

## 2020-12-22 LAB — PROTIME-INR
INR: 1 (ref 0.8–1.2)
Prothrombin Time: 12.9 seconds (ref 11.4–15.2)

## 2020-12-22 LAB — COMPREHENSIVE METABOLIC PANEL WITH GFR
ALT: 28 U/L (ref 0–44)
AST: 76 U/L — ABNORMAL HIGH (ref 15–41)
Albumin: 3 g/dL — ABNORMAL LOW (ref 3.5–5.0)
Alkaline Phosphatase: 44 U/L (ref 38–126)
Anion gap: 18 — ABNORMAL HIGH (ref 5–15)
BUN: 31 mg/dL — ABNORMAL HIGH (ref 8–23)
CO2: 17 mmol/L — ABNORMAL LOW (ref 22–32)
Calcium: 8.2 mg/dL — ABNORMAL LOW (ref 8.9–10.3)
Chloride: 104 mmol/L (ref 98–111)
Creatinine, Ser: 1.68 mg/dL — ABNORMAL HIGH (ref 0.44–1.00)
GFR, Estimated: 32 mL/min — ABNORMAL LOW
Glucose, Bld: 120 mg/dL — ABNORMAL HIGH (ref 70–99)
Potassium: 4.3 mmol/L (ref 3.5–5.1)
Sodium: 139 mmol/L (ref 135–145)
Total Bilirubin: 0.8 mg/dL (ref 0.3–1.2)
Total Protein: 6.7 g/dL (ref 6.5–8.1)

## 2020-12-22 LAB — LACTIC ACID, PLASMA
Lactic Acid, Venous: 1.2 mmol/L (ref 0.5–1.9)
Lactic Acid, Venous: 1.1 mmol/L (ref 0.5–1.9)

## 2020-12-22 LAB — CBC WITH DIFFERENTIAL/PLATELET
Abs Immature Granulocytes: 0.03 10*3/uL (ref 0.00–0.07)
Basophils Absolute: 0 10*3/uL (ref 0.0–0.1)
Basophils Relative: 0 %
Eosinophils Absolute: 0 10*3/uL (ref 0.0–0.5)
Eosinophils Relative: 0 %
HCT: 27 % — ABNORMAL LOW (ref 36.0–46.0)
Hemoglobin: 8.8 g/dL — ABNORMAL LOW (ref 12.0–15.0)
Immature Granulocytes: 1 %
Lymphocytes Relative: 12 %
Lymphs Abs: 0.4 10*3/uL — ABNORMAL LOW (ref 0.7–4.0)
MCH: 32.2 pg (ref 26.0–34.0)
MCHC: 32.6 g/dL (ref 30.0–36.0)
MCV: 98.9 fL (ref 80.0–100.0)
Monocytes Absolute: 0.2 10*3/uL (ref 0.1–1.0)
Monocytes Relative: 5 %
Neutro Abs: 3.2 10*3/uL (ref 1.7–7.7)
Neutrophils Relative %: 82 %
Platelets: 100 10*3/uL — ABNORMAL LOW (ref 150–400)
RBC: 2.73 MIL/uL — ABNORMAL LOW (ref 3.87–5.11)
RDW: 14.6 % (ref 11.5–15.5)
WBC: 3.8 10*3/uL — ABNORMAL LOW (ref 4.0–10.5)
nRBC: 0 % (ref 0.0–0.2)

## 2020-12-22 LAB — TROPONIN I (HIGH SENSITIVITY)
Troponin I (High Sensitivity): 12 ng/L (ref <18)
Troponin I (High Sensitivity): 15 ng/L (ref <18)

## 2020-12-22 LAB — PROCALCITONIN
Procalcitonin: 0.21 ng/mL
Procalcitonin: 0.52 ng/mL

## 2020-12-22 LAB — LACTATE DEHYDROGENASE: LDH: 173 U/L (ref 98–192)

## 2020-12-22 LAB — POC SARS CORONAVIRUS 2 AG -  ED: SARS Coronavirus 2 Ag: POSITIVE — AB

## 2020-12-22 LAB — C-REACTIVE PROTEIN: CRP: 9.8 mg/dL — ABNORMAL HIGH (ref <1.0)

## 2020-12-22 LAB — FIBRIN DERIVATIVES D-DIMER (ARMC ONLY): Fibrin derivatives D-dimer (ARMC): 3942.07 ng/mL (FEU) — ABNORMAL HIGH (ref 0.00–499.00)

## 2020-12-22 LAB — APTT: aPTT: 31 s (ref 24–36)

## 2020-12-22 LAB — BRAIN NATRIURETIC PEPTIDE: B Natriuretic Peptide: 71.5 pg/mL (ref 0.0–100.0)

## 2020-12-22 LAB — FERRITIN: Ferritin: 376 ng/mL — ABNORMAL HIGH (ref 11–307)

## 2020-12-22 MED ORDER — FAMOTIDINE 20 MG PO TABS
20.0000 mg | ORAL_TABLET | Freq: Two times a day (BID) | ORAL | Status: DC
  Administered 2020-12-22: 20 mg via ORAL
  Filled 2020-12-22: qty 1

## 2020-12-22 MED ORDER — VITAMIN D 25 MCG (1000 UNIT) PO TABS
1000.0000 [IU] | ORAL_TABLET | Freq: Every day | ORAL | Status: DC
  Administered 2020-12-22 – 2020-12-24 (×3): 1000 [IU] via ORAL
  Filled 2020-12-22 (×3): qty 1

## 2020-12-22 MED ORDER — PROMETHAZINE HCL 25 MG PO TABS: 25.0000 mg | ORAL_TABLET | ORAL | Status: DC | PRN

## 2020-12-22 MED ORDER — ASPIRIN EC 81 MG PO TBEC
81.0000 mg | DELAYED_RELEASE_TABLET | Freq: Every day | ORAL | Status: DC
  Administered 2020-12-22 – 2020-12-24 (×3): 81 mg via ORAL
  Filled 2020-12-22 (×3): qty 1

## 2020-12-22 MED ORDER — ONDANSETRON HCL 4 MG/2ML IJ SOLN: 4.0000 mg | Freq: Four times a day (QID) | INTRAMUSCULAR | Status: DC | PRN

## 2020-12-22 MED ORDER — ASPIRIN EC 81 MG PO TBEC: 81.0000 mg | DELAYED_RELEASE_TABLET | Freq: Every day | ORAL | Status: DC

## 2020-12-22 MED ORDER — ENOXAPARIN SODIUM 30 MG/0.3ML ~~LOC~~ SOLN
30.0000 mg | SUBCUTANEOUS | Status: DC
  Administered 2020-12-22 – 2020-12-23 (×2): 30 mg via SUBCUTANEOUS
  Filled 2020-12-22 (×2): qty 0.3

## 2020-12-22 MED ORDER — PREDNISONE 50 MG PO TABS: 50.0000 mg | ORAL_TABLET | Freq: Every day | ORAL | Status: DC

## 2020-12-22 MED ORDER — ACETAMINOPHEN 325 MG PO TABS: 650.0000 mg | ORAL_TABLET | Freq: Four times a day (QID) | ORAL | Status: DC | PRN

## 2020-12-22 MED ORDER — ONDANSETRON HCL 4 MG PO TABS: 4.0000 mg | ORAL_TABLET | Freq: Four times a day (QID) | ORAL | Status: DC | PRN

## 2020-12-22 MED ORDER — FAMOTIDINE 20 MG PO TABS
20.0000 mg | ORAL_TABLET | Freq: Every day | ORAL | Status: DC
  Administered 2020-12-22 – 2020-12-23 (×2): 20 mg via ORAL
  Filled 2020-12-22 (×2): qty 1

## 2020-12-22 MED ORDER — HYDROCOD POLST-CPM POLST ER 10-8 MG/5ML PO SUER: 5.0000 mL | Freq: Two times a day (BID) | ORAL | Status: DC | PRN

## 2020-12-22 MED ORDER — SODIUM CHLORIDE 0.9 % IV SOLN: INTRAVENOUS | Status: DC

## 2020-12-22 MED ORDER — ZINC SULFATE 220 (50 ZN) MG PO CAPS
220.0000 mg | ORAL_CAPSULE | Freq: Every day | ORAL | Status: DC
  Administered 2020-12-22 – 2020-12-24 (×3): 220 mg via ORAL
  Filled 2020-12-22 (×3): qty 1

## 2020-12-22 MED ORDER — TAMSULOSIN HCL 0.4 MG PO CAPS
0.4000 mg | ORAL_CAPSULE | Freq: Every day | ORAL | Status: DC
  Administered 2020-12-22 – 2020-12-24 (×3): 0.4 mg via ORAL
  Filled 2020-12-22 (×3): qty 1

## 2020-12-22 MED ORDER — SODIUM CHLORIDE 0.9 % IV SOLN
2.0000 g | INTRAVENOUS | Status: DC
  Administered 2020-12-22: 2 g via INTRAVENOUS
  Filled 2020-12-22: qty 2
  Filled 2020-12-22 (×2): qty 20

## 2020-12-22 MED ORDER — LABETALOL HCL 5 MG/ML IV SOLN: 20.0000 mg | INTRAVENOUS | Status: DC | PRN

## 2020-12-22 MED ORDER — ATENOLOL 25 MG PO TABS
25.0000 mg | ORAL_TABLET | Freq: Every day | ORAL | Status: DC
  Administered 2020-12-22: 25 mg via ORAL
  Filled 2020-12-22 (×4): qty 1

## 2020-12-22 MED ORDER — PRAVASTATIN SODIUM 20 MG PO TABS
80.0000 mg | ORAL_TABLET | Freq: Every day | ORAL | Status: DC
  Administered 2020-12-22 – 2020-12-23 (×2): 80 mg via ORAL
  Filled 2020-12-22 (×2): qty 4
  Filled 2020-12-22: qty 2

## 2020-12-22 MED ORDER — ACETAMINOPHEN 325 MG PO TABS
650.0000 mg | ORAL_TABLET | Freq: Four times a day (QID) | ORAL | Status: DC | PRN
  Administered 2020-12-22 – 2020-12-23 (×3): 650 mg via ORAL
  Filled 2020-12-22 (×3): qty 2

## 2020-12-22 MED ORDER — TRAZODONE HCL 50 MG PO TABS: 25.0000 mg | ORAL_TABLET | Freq: Every evening | ORAL | Status: DC | PRN

## 2020-12-22 MED ORDER — METHYLPREDNISOLONE SODIUM SUCC 125 MG IJ SOLR
1.0000 mg/kg | Freq: Two times a day (BID) | INTRAMUSCULAR | Status: DC
Start: 2020-12-22 — End: 2020-12-24
  Administered 2020-12-22 – 2020-12-23 (×5): 72.5 mg via INTRAVENOUS
  Filled 2020-12-22 (×5): qty 2

## 2020-12-22 MED ORDER — ASCORBIC ACID 500 MG PO TABS
500.0000 mg | ORAL_TABLET | Freq: Every day | ORAL | Status: DC
  Administered 2020-12-22 – 2020-12-24 (×3): 500 mg via ORAL
  Filled 2020-12-22 (×3): qty 1

## 2020-12-22 MED ORDER — GUAIFENESIN ER 600 MG PO TB12
600.0000 mg | ORAL_TABLET | Freq: Two times a day (BID) | ORAL | Status: DC
  Administered 2020-12-22 – 2020-12-24 (×6): 600 mg via ORAL
  Filled 2020-12-22 (×6): qty 1

## 2020-12-22 MED ORDER — SODIUM CHLORIDE 0.9 % IV SOLN
500.0000 mg | INTRAVENOUS | Status: DC
  Administered 2020-12-22: 500 mg via INTRAVENOUS
  Filled 2020-12-22: qty 500

## 2020-12-22 MED ORDER — LOSARTAN POTASSIUM 50 MG PO TABS: 25.0000 mg | ORAL_TABLET | Freq: Every day | ORAL | Status: DC

## 2020-12-22 MED ORDER — MAGNESIUM HYDROXIDE 400 MG/5ML PO SUSP
30.0000 mL | Freq: Every day | ORAL | Status: DC | PRN
  Filled 2020-12-22: qty 30

## 2020-12-22 MED ORDER — GUAIFENESIN-DM 100-10 MG/5ML PO SYRP
10.0000 mL | ORAL_SOLUTION | ORAL | Status: DC | PRN
Start: 2020-12-22 — End: 2020-12-24

## 2020-12-22 NOTE — ED Notes (Signed)
MD Mansy at bedside discussing plan of care

## 2020-12-22 NOTE — Progress Notes (Signed)
Son Levada Dy given nightly update

## 2020-12-22 NOTE — ED Notes (Signed)
Lab at bedside

## 2020-12-22 NOTE — H&P (Signed)
[place]   PATIENT NAME: Sheri Butler    MR#:  [id number]  DATE OF BIRTH:  09-29-1946  DATE OF ADMISSION:  12/21/2020  PRIMARY CARE PHYSICIAN: Denton Lank, MD   REQUESTING/REFERRING PHYSICIAN: Naaman Plummer, MD  CHIEF COMPLAINT:   Chief Complaint  Patient presents with  . Fever  . Code Sepsis    HISTORY OF PRESENT ILLNESS:  Sheri Butler  is a 75 y.o. African-American female with a known history of hypertension and dyslipidemia, who presented to the emergency room by her children with acute onset of altered mental status.  According to her children she was not able to state what she is and the patient was saying that their children "worry about everything".  The patient admitted to loss of smell.  She denied any cough or wheezing or dyspnea.  No chest pain or palpitations.  No nausea or vomiting or diarrhea.  Temperature was 102.1 here and blood pressure was elevated.  No dysuria, oliguria or hematuria or flank pain.  No headache or dizziness or blurred vision.  Upon presentation to the ER, temperature was 102.1 with blood pressure of 206/101, heart rate of 147 with respiratory Rate was 26 with pulse oximetry of 100% on room air.  Labs revealed a creatinine of 1.8 with a BUN of 30 compared to 6 and 0.57 on 417/2019.  Lactic acid was 3.3 and procalcitonin 0.21.  CBC showed anemia slightly worse than previous levels in 2019. Blood culture were drawn.  Chest x-ray showed very mild left basal linear atelectasis with mild right hilar prominence.  Chest CTA showed the following: 1. Multiple foci of ground-glass density at the bilateral lung bases suspicious for multifocal pneumonia, potential viral pneumonia. 2. Status post right hemicolectomy with anastomosis in the right anterior upper abdomen. No evidence for a bowel obstruction. Sigmoid colon diverticular disease without acute inflammatory change. 3. Apparent 5 x 7 mm calcification in the region of the proximal right  ureter without significant upstream obstruction. There are additional intrarenal stones on the right. 4. Hepatic steatosis. Gallstones. 5.  Aortic Atherosclerosis.  The patient was given IV Rocephin, 650 mg p.o. Tylenol, IV Solu-Medrol and remdesivir.  She will be admitted to a medical monitored bed for further evaluation and management.  PAST MEDICAL HISTORY:   Past Medical History:  Diagnosis Date  . Hyperlipidemia   . Hypertension   -Tremors  PAST SURGICAL HISTORY:   Past Surgical History:  Procedure Laterality Date  . COLONOSCOPY WITH PROPOFOL N/A 10/22/2017   Procedure: COLONOSCOPY WITH PROPOFOL;  Surgeon: Jonathon Bellows, MD;  Location: [facility] ENDOSCOPY;  Service: Gastroenterology;  Laterality: N/A;  . ESOPHAGOGASTRODUODENOSCOPY (EGD) WITH PROPOFOL N/A 10/22/2017   Procedure: ESOPHAGOGASTRODUODENOSCOPY (EGD) WITH PROPOFOL;  Surgeon: Jonathon Bellows, MD;  Location: [facility] ENDOSCOPY;  Service: Gastroenterology;  Laterality: N/A;  . none      SOCIAL HISTORY:   Social History   Tobacco Use  . Smoking status: Never Smoker  . Smokeless tobacco: Never Used  Substance Use Topics  . Alcohol use: Yes    Alcohol/week: 14.0 standard drinks    Types: 14 Cans of beer per week    Comment: everyday    FAMILY HISTORY:   Family History  Problem Relation Age of Onset  . Hypertension Other   . CAD Neg Hx   . Diabetes Mellitus II Neg Hx     DRUG ALLERGIES:   Allergies  Allergen Reactions  . Sulfa Antibiotics Anaphylaxis    REVIEW OF  SYSTEMS:   ROS As per history of present illness. All pertinent systems were reviewed above. Constitutional, HEENT, cardiovascular, respiratory, GI, GU, musculoskeletal, neuro, psychiatric, endocrine, integumentary and hematologic systems were reviewed and are otherwise negative/unremarkable except for positive findings mentioned above in the HPI.   MEDICATIONS AT HOME:   Prior to Admission medications   Medication Sig Start Date End Date Taking?  Authorizing Provider  acetaminophen (TYLENOL) 325 MG tablet Take 2 tablets (650 mg total) by mouth every 6 (six) hours as needed for mild pain (or Fever >/= 101). 11/12/15   Nicholes Mango, MD  aspirin EC 81 MG tablet Take 81 mg by mouth daily.  04/30/09   [provider]  atenolol (TENORMIN) 25 MG tablet Take 25 mg by mouth daily.  10/19/15   [provider]  IBU 800 MG tablet Take 800 mg by mouth every 6 (six) hours as needed.  11/13/17   [provider]  losartan (COZAAR) 25 MG tablet Take 1 tablet daily by mouth. 08/03/17   [provider]  pravastatin (PRAVACHOL) 80 MG tablet Take 1 tablet by mouth daily. 10/19/15   [provider]  promethazine (PHENERGAN) 25 MG tablet Take 1 tablet (25 mg total) by mouth every 4 (four) hours as needed for nausea or vomiting. 03/24/18   Earleen Newport, MD  tamsulosin (FLOMAX) 0.4 MG CAPS capsule Take 0.4 mg by mouth daily.    [provider]      VITAL SIGNS:  Blood pressure (!) 159/103, pulse (!) 106, temperature 99.5 F (37.5 C), temperature source Oral, resp. rate (!) 22, height 5' (1.524 m), weight 72.6 kg, SpO2 100 %.  PHYSICAL EXAMINATION:  Physical Exam  GENERAL:  75 y.o.-year-old African-American patient lying in the bed with no acute distress.  EYES: Pupils equal, round, reactive to light and accommodation. No scleral icterus. Extraocular muscles intact.  HEENT: Head atraumatic, normocephalic. Oropharynx and nasopharynx clear.  NECK:  Supple, no jugular venous distention. No thyroid enlargement, no tenderness.  LUNGS: Slightly diminished bibasal breath sounds. CARDIOVASCULAR: Regular rate and rhythm, S1, S2 normal. No murmurs, rubs, or gallops.  ABDOMEN: Soft, nondistended, nontender. Bowel sounds present. No organomegaly or mass.  EXTREMITIES: No pedal edema, cyanosis, or clubbing.  NEUROLOGIC: Cranial nerves II through XII are intact. Muscle strength 5/5 in all extremities. Sensation  intact. Gait not checked.  PSYCHIATRIC: The patient is alert and oriented x 3.  Normal affect and good eye contact. SKIN: No obvious rash, lesion, or ulcer.   LABORATORY PANEL:   CBC Recent Labs  Lab 12/21/20 2100  WBC 7.2  HGB 10.1*  HCT 29.5*  PLT 100*   ------------------------------------------------------------------------------------------------------------------  Chemistries  Recent Labs  Lab 12/21/20 2100  NA 137  K 4.4  CL 98  CO2 20*  GLUCOSE 128*  BUN 30*  CREATININE 1.81*  CALCIUM 9.4  AST 102*  ALT 35  ALKPHOS 55  BILITOT 1.0   ------------------------------------------------------------------------------------------------------------------  Cardiac Enzymes No results for input(s): TROPONINI in the last 168 hours. ------------------------------------------------------------------------------------------------------------------  RADIOLOGY:  CT ABDOMEN PELVIS WO CONTRAST  Result Date: 12/21/2020 CLINICAL DATA:  Abdomen pain fever EXAM: CT ABDOMEN AND PELVIS WITHOUT CONTRAST TECHNIQUE: Multidetector CT imaging of the abdomen and pelvis was performed following the standard protocol without IV contrast. COMPARISON:  CT 03/19/2018 FINDINGS: Lower chest: Lung bases demonstrate scattered foci of ground-glass density suspicious for pneumonia. No pleural effusion. Borderline cardiomegaly. Hepatobiliary: Hepatic steatosis. Multiple calcified gallstones. No biliary dilatation Pancreas: Unremarkable. No pancreatic ductal dilatation  or surrounding inflammatory changes. Spleen: Normal in size without focal abnormality. Adrenals/Urinary Tract: Adrenal glands are within normal limits. Small stones within the right kidney. Subcentimeter exophytic lesion off the posterior lower pole right kidney too small to further characterize. Apparent 5 by 7 mm calcification in the region of proximal right ureter about 2 cm distal to the right UPJ without significant upstream obstruction. The  bladder is unremarkable. Stomach/Bowel: The stomach is nonenlarged. No dilated small bowel. Status post right hemicolectomy with anastomosis in the right anterior upper abdomen. No evidence for a bowel obstruction. Sigmoid colon diverticular disease without acute inflammatory change. Vascular/Lymphatic: Mild aortic atherosclerosis. No aneurysm. No suspicious nodes. Reproductive: Uterus and bilateral adnexa are unremarkable. Other: Negative for free air or free fluid. Subcutaneous scarring in the left anterior abdominal wall. Musculoskeletal: No acute or significant osseous findings. Degenerative changes of the spine IMPRESSION: 1. Multiple foci of ground-glass density at the bilateral lung bases suspicious for multifocal pneumonia, potential viral pneumonia. 2. Status post right hemicolectomy with anastomosis in the right anterior upper abdomen. No evidence for a bowel obstruction. Sigmoid colon diverticular disease without acute inflammatory change. 3. Apparent 5 x 7 mm calcification in the region of the proximal right ureter without significant upstream obstruction. There are additional intrarenal stones on the right. 4. Hepatic steatosis. Gallstones. Aortic Atherosclerosis (ICD10-I70.0). Electronically Signed   By: Donavan Foil M.D.   On: 12/21/2020 22:57   DG Chest 1 View  Result Date: 12/21/2020 CLINICAL DATA:  Fever. EXAM: CHEST  1 VIEW COMPARISON:  None. FINDINGS: Very mild linear atelectasis is seen within the lateral aspect of the left lung base. There is no evidence of an acute infiltrate, pleural effusion or pneumothorax. The cardiac silhouette is mildly enlarged. Mild prominence of the right hilar region is seen. Degenerative changes are seen within the mid and lower thoracic spine. IMPRESSION: 1. Very mild left basilar linear atelectasis. 2. Mild right hilar prominence which may be vascular in origin. Correlation with nonemergent chest CT is recommended. Electronically Signed   By: Virgina Norfolk M.D.   On: 12/21/2020 21:38      IMPRESSION AND PLAN:   1.  Acute encephalopathy and multifocal pneumonia with subsequent sepsis due to COVID-19. The patient had fever, tachycardia and tachypnea in the setting of pneumonia to meet sepsis criteria and elevated lactic acid level of 3.3 to meet severe sepsis criteria. - The patient will be admitted to a medical monitored isolation bed. -Her mental status has improved given improvement of her fever. - We will continue her on IV remdesivir and Solu-Medrol.. - We will hydrate with IV normal saline. - We will follow neuro checks every 4 hours for 24 hours. - She will be placed on vitamins D and C as well as zinc and aspirin. - We will follow blood culture and sputum culture. - Given elevated lactic acid and slightly elevated procalcitonin 0.21 we will place on IV Rocephin and Zithromax pending blood cultures results.  2.  Acute kidney injury. - This is likely prerenal due to volume depletion and dehydration. - The patient will be hydrated with IV normal saline and will follow BMP. - We will hold off any nephrotoxins.  3.  Hypertensive urgency that could be contributing to her encephalopathy bedside fever and COVID-19. -The patient will be placed on as needed IV labetalol and will continue atenolol and hold off Cozaar.  4.  Dyslipidemia. - We will continue statin therapy.  5.  History of tremors. - We  will continue Sinemet.  6.  DVT prophylaxis. - Subtenons Lovenox.   All the records are reviewed and case discussed with ED provider. The plan of care was discussed in details with the patient (and family). I answered all questions. The patient agreed to proceed with the above mentioned plan. Further management will depend upon hospital course.   CODE STATUS: Full code  Status is: Inpatient  Remains inpatient appropriate because:Altered mental status, Ongoing diagnostic testing needed not appropriate for outpatient work up,  Unsafe d/c plan, IV treatments appropriate due to intensity of illness or inability to take PO and Inpatient level of care appropriate due to severity of illness   Dispo: The patient is from: Home              Anticipated d/c is to: Home              Anticipated d/c date is: 3 days              Patient currently is not medically stable to d/c.   TOTAL TIME TAKING CARE OF THIS PATIENT: 55 minutes.    Christel Mormon M.D on 12/22/2020 at 12:21 AM  [facility]   From 7 PM-7 AM, contact night-coverage [email or web address]  CC: Primary care physician; Denton Lank, MD

## 2020-12-22 NOTE — ED Notes (Signed)
Spoke to son and provided update on pt.

## 2020-12-22 NOTE — ED Notes (Signed)
Pt had small BM. Cleaned with wipes and new brief applied. Pt able to roll side to side.

## 2020-12-22 NOTE — ED Notes (Signed)
Pt repositioned. Given lunch tray.

## 2020-12-22 NOTE — Progress Notes (Signed)
[facility] [place] NAME: Sheri Butler    MR#:  [id number]  DATE OF BIRTH:  03/20/1946  SUBJECTIVE:  patient awake alert oriented times three. Told me her family got worried she was not acting her usual self brought her to the emergency room. She is not hungry doesn't feel like eating food  REVIEW OF SYSTEMS:   Review of Systems  Constitutional: Negative for chills, fever and weight loss.  HENT: Negative for ear discharge, ear pain and nosebleeds.   Eyes: Negative for blurred vision, pain and discharge.  Respiratory: Negative for sputum production, shortness of breath, wheezing and stridor.   Cardiovascular: Negative for chest pain, palpitations, orthopnea and PND.  Gastrointestinal: Negative for abdominal pain, diarrhea, nausea and vomiting.  Genitourinary: Negative for frequency and urgency.  Musculoskeletal: Negative for back pain and joint pain.  Neurological: Negative for sensory change, speech change, focal weakness and weakness.  Psychiatric/Behavioral: Negative for depression and hallucinations. The patient is not nervous/anxious.    Tolerating Diet:little Tolerating PT: pending  DRUG ALLERGIES:   Allergies  Allergen Reactions  . Sulfa Antibiotics Anaphylaxis    VITALS:  Blood pressure (!) 131/100, pulse 67, temperature 99.5 F (37.5 C), temperature source Oral, resp. rate 15, height 5' (1.524 m), weight 72.6 kg, SpO2 100 %.  PHYSICAL EXAMINATION:   Physical Exam  GENERAL:  75 y.o.-year-old patient lying in the bed with no acute distress. Obese HEENT: Head atraumatic, normocephalic. Oropharynx and nasopharynx clear.  NECK:  Supple, no jugular venous distention. No thyroid enlargement, no tenderness.  LUNGS: Normal breath sounds bilaterally, no wheezing, rales, rhonchi. No use of accessory muscles of respiration.  CARDIOVASCULAR: S1, S2 normal. No murmurs, rubs, or gallops.  ABDOMEN: Soft, nontender, nondistended.  Bowel sounds present. No organomegaly or mass.  EXTREMITIES: No cyanosis, clubbing or edema b/l.    NEUROLOGIC: Cranial nerves II through XII are intact. No focal Motor or sensory deficits b/l.   PSYCHIATRIC:  patient is alert and oriented x 2.  SKIN: No obvious rash, lesion, or ulcer.   LABORATORY PANEL:  CBC Recent Labs  Lab 12/22/20 0635  WBC 3.8*  HGB 8.8*  HCT 27.0*  PLT 100*    Chemistries  Recent Labs  Lab 12/22/20 0635  NA 139  K 4.3  CL 104  CO2 17*  GLUCOSE 120*  BUN 31*  CREATININE 1.68*  CALCIUM 8.2*  AST 76*  ALT 28  ALKPHOS 44  BILITOT 0.8   Cardiac Enzymes No results for input(s): TROPONINI in the last 168 hours. RADIOLOGY:  CT ABDOMEN PELVIS WO CONTRAST  Result Date: 12/21/2020 CLINICAL DATA:  Abdomen pain fever EXAM: CT ABDOMEN AND PELVIS WITHOUT CONTRAST TECHNIQUE: Multidetector CT imaging of the abdomen and pelvis was performed following the standard protocol without IV contrast. COMPARISON:  CT 03/19/2018 FINDINGS: Lower chest: Lung bases demonstrate scattered foci of ground-glass density suspicious for pneumonia. No pleural effusion. Borderline cardiomegaly. Hepatobiliary: Hepatic steatosis. Multiple calcified gallstones. No biliary dilatation Pancreas: Unremarkable. No pancreatic ductal dilatation or surrounding inflammatory changes. Spleen: Normal in size without focal abnormality. Adrenals/Urinary Tract: Adrenal glands are within normal limits. Small stones within the right kidney. Subcentimeter exophytic lesion off the posterior lower pole right kidney too small to further characterize. Apparent 5 by 7 mm calcification in the region of proximal right ureter about 2 cm distal to the right UPJ without significant upstream obstruction. The bladder is unremarkable. Stomach/Bowel: The stomach is nonenlarged. No dilated small bowel.  Status post right hemicolectomy with anastomosis in the right anterior upper abdomen. No evidence for a bowel obstruction.  Sigmoid colon diverticular disease without acute inflammatory change. Vascular/Lymphatic: Mild aortic atherosclerosis. No aneurysm. No suspicious nodes. Reproductive: Uterus and bilateral adnexa are unremarkable. Other: Negative for free air or free fluid. Subcutaneous scarring in the left anterior abdominal wall. Musculoskeletal: No acute or significant osseous findings. Degenerative changes of the spine IMPRESSION: 1. Multiple foci of ground-glass density at the bilateral lung bases suspicious for multifocal pneumonia, potential viral pneumonia. 2. Status post right hemicolectomy with anastomosis in the right anterior upper abdomen. No evidence for a bowel obstruction. Sigmoid colon diverticular disease without acute inflammatory change. 3. Apparent 5 x 7 mm calcification in the region of the proximal right ureter without significant upstream obstruction. There are additional intrarenal stones on the right. 4. Hepatic steatosis. Gallstones. Aortic Atherosclerosis (ICD10-I70.0). Electronically Signed   By: Donavan Foil M.D.   On: 12/21/2020 22:57   DG Chest 1 View  Result Date: 12/21/2020 CLINICAL DATA:  Fever. EXAM: CHEST  1 VIEW COMPARISON:  None. FINDINGS: Very mild linear atelectasis is seen within the lateral aspect of the left lung base. There is no evidence of an acute infiltrate, pleural effusion or pneumothorax. The cardiac silhouette is mildly enlarged. Mild prominence of the right hilar region is seen. Degenerative changes are seen within the mid and lower thoracic spine. IMPRESSION: 1. Very mild left basilar linear atelectasis. 2. Mild right hilar prominence which may be vascular in origin. Correlation with nonemergent chest CT is recommended. Electronically Signed   By: Virgina Norfolk M.D.   On: 12/21/2020 21:38   ASSESSMENT AND PLAN:  Sheri Butler  is a 75 y.o. African-American female with a known history of hypertension and dyslipidemia, who presented to the emergency room by her  children with acute onset of altered mental status.  According to her children she was not able to state what she is and the patient was saying that their children "worry about everything".  The patient admitted to loss of smell  Acute encephalopathy and multifocal COVID 19 pneumonia with subsequent severe sepsis due to COVID-19 and UTI--POA --fever, tachycardia and tachypnea in the setting of pneumonia Acute renal failure to meet sepsis criteria and elevated lactic acid level of 3.3 to meet severe sepsis criteria. -Her mental status has improved given improvement of her fever. -  on IV remdesivir and Solu-Medrol.. -  hydrate with IV normal saline. - on vitamins D and C as well as zinc and aspirin. -  blood culture  Neg  -UA positive for UTI, UC pending -  lactic acid trending down. --BNP 71 --CRP 9.8 -IV rocephin for UTI   Acute kidney injury with metabolic acidosis -- BaselineCreatinine 0.49 (2019) -  likely prerenal due to volume depletion and dehydration. -Came in with creatinine of 1.8--- 1.68 - We will hold off any nephrotoxins. -- Continue IV fluids  Hypertensive urgency that could be contributing to her encephalopathy bedside fever and COVID-19. -placed on as needed IV labetalol and will continue atenolol and hold off Cozaar. --BP much better  Dyslipidemia. -  continue statin therapy.    History of tremors. -  continue Sinemet.   DVT prophylaxis. - SubQ Lovenox.   PT/OT to see pt-- uses [facility]. Patient lives with wife son and daughter-in-law at home.   CODE STATUS: Full code  Status is: Inpatient  Remains inpatient appropriate because:Altered mental status, Ongoing diagnostic testing needed not appropriate for  outpatient work up, Unsafe d/c plan, IV treatments appropriate due to intensity of illness or inability to take PO and Inpatient level of care appropriate due to severity of illness   Dispo: The patient is from: Home   Anticipated d/c is to: Home  Anticipated d/c date is: 3 days  Patient currently is not medically stable to d/c. Discuss with patient son Marisue Ivan         TOTAL TIME TAKING CARE OF THIS PATIENT: *25* minutes.  >50% time spent on counselling and coordination of care  Note: This dictation was prepared with Dragon dictation along with smaller phrase technology. Any transcriptional errors that result from this process are unintentional.  Fritzi Mandes M.D    [facility]   CC: Primary care physician; Denton Lank, MDPatient ID: Sheri Butler, female   DOB: 21-May-1946, 75 y.o.   MRN: [id number]

## 2020-12-22 NOTE — ED Notes (Signed)
3 attempts made at collecting patient labs without success. Lab called to request phlebotomy lab draw. Per lab staff, will send someone soon.

## 2020-12-22 NOTE — ED Notes (Signed)
Pharmacy contacted requesting to send ordered Remdesivir for RN to administer.

## 2020-12-22 NOTE — ED Notes (Signed)
ED Charge RN Levada Dy notified of situation. Charge stated talked with [facility] who stated go ahead and take pt back up to room. Stated to leave stretcher for pt upstairs if they still did not have bed in pt's clean room.

## 2020-12-22 NOTE — ED Notes (Signed)
Pt had small soft BM. Peri Care provided. Briefs and linens changed.

## 2020-12-22 NOTE — Progress Notes (Signed)
PHARMACY NOTE:   RENAL DOSAGE ADJUSTMENT   Current Medication dosage:  Famotidine 20mg  BID   Renal Function: Estimated Creatinine Clearance: 25.7 mL/min (A) (by C-G formula based on SCr of 1.68 mg/dL (H)).     Dosage has been changed to:  Famotidine 20mg  every 24 hours for CrCl <43ml/min   Thank you for allowing pharmacy to be a part of this patient's care.  Pernell Dupre, PharmD, BCPS Clinical Pharmacist 12/22/2020 8:34 AM

## 2020-12-22 NOTE — ED Notes (Signed)
Transported pt back to room 115 per Agricultural consultant. Bed locked low. Rail up. Call bell given to pt. Pt given tv remote. Paper chart given to secretary and requested secretary notify pt's nurse that pt in room. Secretary stated she would notify pt's floor RN. All nurses at nurses station which this RN had to pass by to take pt to room.

## 2020-12-22 NOTE — ED Notes (Signed)
Talked with Ginger on floor to confirm ok to go ahead and bring pt up to room. Confirmed.

## 2020-12-22 NOTE — ED Notes (Signed)
Pt had to take meds in applesauce. States she has trouble swallowing larger pills.

## 2020-12-22 NOTE — ED Notes (Signed)
Took pt up to floor room. Was never notified room assignment was changed so was told floor nurse "never received report" as room assignment green in chart. However, nurse then stated couldn't find results for covid swab in pt's chart. Per EDP Smith covid swab noted completed yesterday and positive result noted in [place] and provider Mansy's notes. Issue with positive result pulling over from lab into chart.

## 2020-12-22 NOTE — Progress Notes (Signed)
PHARMACIST - PHYSICIAN COMMUNICATION  CONCERNING:  Enoxaparin (Lovenox) for DVT Prophylaxis    RECOMMENDATION: Patient was prescribed enoxaprin 40mg  q24 hours for VTE prophylaxis.   Filed Weights   12/21/20 2022  Weight: 72.6 kg (160 lb)    Body mass index is 31.25 kg/m.  Estimated Creatinine Clearance: 23.9 mL/min (A) (by C-G formula based on SCr of 1.81 mg/dL (H)).   Patient is candidate for enoxaparin 30mg  every 24 hours based on CrCl <31ml/min or Weight <45kg  DESCRIPTION: Pharmacy has adjusted enoxaparin dose per [facility] policy.  Patient is now receiving enoxaparin 30 mg every 24 hours    Ena Dawley, PharmD Clinical Pharmacist  12/22/2020 6:30 AM

## 2020-12-23 DIAGNOSIS — N179 Acute kidney failure, unspecified: Secondary | ICD-10-CM | POA: Diagnosis not present

## 2020-12-23 DIAGNOSIS — U071 COVID-19: Secondary | ICD-10-CM | POA: Diagnosis not present

## 2020-12-23 DIAGNOSIS — A419 Sepsis, unspecified organism: Secondary | ICD-10-CM | POA: Diagnosis not present

## 2020-12-23 DIAGNOSIS — R6520 Severe sepsis without septic shock: Secondary | ICD-10-CM | POA: Diagnosis not present

## 2020-12-23 LAB — CBC WITH DIFFERENTIAL/PLATELET
Abs Immature Granulocytes: 0.05 10*3/uL (ref 0.00–0.07)
Basophils Absolute: 0 10*3/uL (ref 0.0–0.1)
Basophils Relative: 0 %
Eosinophils Absolute: 0 10*3/uL (ref 0.0–0.5)
Eosinophils Relative: 0 %
HCT: 25.8 % — ABNORMAL LOW (ref 36.0–46.0)
Hemoglobin: 8.3 g/dL — ABNORMAL LOW (ref 12.0–15.0)
Immature Granulocytes: 1 %
Lymphocytes Relative: 9 %
Lymphs Abs: 0.6 10*3/uL — ABNORMAL LOW (ref 0.7–4.0)
MCH: 32.2 pg (ref 26.0–34.0)
MCHC: 32.2 g/dL (ref 30.0–36.0)
MCV: 100 fL (ref 80.0–100.0)
Monocytes Absolute: 0.3 10*3/uL (ref 0.1–1.0)
Monocytes Relative: 4 %
Neutro Abs: 5.6 10*3/uL (ref 1.7–7.7)
Neutrophils Relative %: 86 %
Platelets: 116 10*3/uL — ABNORMAL LOW (ref 150–400)
RBC: 2.58 MIL/uL — ABNORMAL LOW (ref 3.87–5.11)
RDW: 14.9 % (ref 11.5–15.5)
WBC: 6.5 10*3/uL (ref 4.0–10.5)
nRBC: 0 % (ref 0.0–0.2)

## 2020-12-23 LAB — COMPREHENSIVE METABOLIC PANEL
ALT: 31 U/L (ref 0–44)
AST: 85 U/L — ABNORMAL HIGH (ref 15–41)
Albumin: 2.6 g/dL — ABNORMAL LOW (ref 3.5–5.0)
Alkaline Phosphatase: 40 U/L (ref 38–126)
Anion gap: 13 (ref 5–15)
BUN: 36 mg/dL — ABNORMAL HIGH (ref 8–23)
CO2: 18 mmol/L — ABNORMAL LOW (ref 22–32)
Calcium: 7.6 mg/dL — ABNORMAL LOW (ref 8.9–10.3)
Chloride: 111 mmol/L (ref 98–111)
Creatinine, Ser: 1.54 mg/dL — ABNORMAL HIGH (ref 0.44–1.00)
GFR, Estimated: 35 mL/min — ABNORMAL LOW (ref >60)
Glucose, Bld: 157 mg/dL — ABNORMAL HIGH (ref 70–99)
Potassium: 3.8 mmol/L (ref 3.5–5.1)
Sodium: 142 mmol/L (ref 135–145)
Total Bilirubin: 0.6 mg/dL (ref 0.3–1.2)
Total Protein: 6 g/dL — ABNORMAL LOW (ref 6.5–8.1)

## 2020-12-23 LAB — FIBRIN DERIVATIVES D-DIMER (ARMC ONLY): Fibrin derivatives D-dimer (ARMC): 3420.61 ng/mL (FEU) — ABNORMAL HIGH (ref 0.00–499.00)

## 2020-12-23 LAB — C-REACTIVE PROTEIN: CRP: 8.8 mg/dL — ABNORMAL HIGH (ref <1.0)

## 2020-12-23 LAB — FERRITIN: Ferritin: 415 ng/mL — ABNORMAL HIGH (ref 11–307)

## 2020-12-23 MED ORDER — SODIUM CHLORIDE 0.9 % IV SOLN
1.0000 g | INTRAVENOUS | Status: DC
  Administered 2020-12-23: 18:00:00 1 g via INTRAVENOUS
  Filled 2020-12-23 (×2): qty 10

## 2020-12-23 NOTE — Progress Notes (Signed)
Attempted to start IV antibiotics as ordered.  Pt immediately started demanding "get that needle out of me!"  IV was immediately stopped and removed.  No noted redness or swollen or tenderness once the IV was removed.  Unable to start new IV at this time.  Will pass this information along to night nurse.

## 2020-12-23 NOTE — Evaluation (Signed)
Physical Therapy Evaluation Patient Details Name: Sheri Butler MRN: [id number] DOB: 02-28-1946 Today's Date: 12/23/2020   History of Present Illness  Pt is a 75 y.o. African-American female with a known history of hypertension and dyslipidemia, who presented to the emergency room by her children with acute onset of altered mental status. MD assessment includes: Acute encephalopathy and multifocal pneumonia with subsequent sepsis due to COVID-19, AKI, hypertensive urgency, and dyslipidemia.    Clinical Impression  Pt was pleasant and motivated to participate during the session but was limited by functional weakness with all tasks.  Pt required physical assistance with bed mobility and transfers and was only able to take several very small, effortful, shuffling steps at the EOB before needing to return to sitting.  Pt reported no adverse symptoms during the session with SpO2 and HR WNL throughout.  Pt is at a high risk for falls and does not currently possess the functional strength to navigate her home environment safely.  Pt will benefit from PT services in a SNF setting upon discharge to safely address deficits listed in patient problem list for decreased caregiver assistance and eventual return to PLOF.      Follow Up Recommendations SNF;Supervision for mobility/OOB    Equipment Recommendations  None recommended by PT    Recommendations for Other Services       Precautions / Restrictions Precautions Precautions: Fall Restrictions Weight Bearing Restrictions: No      Mobility  Bed Mobility Overal bed mobility: Needs Assistance Bed Mobility: Supine to Sit;Sit to Supine     Supine to sit: Min assist;Mod assist Sit to supine: Min assist;Mod assist   General bed mobility comments: Min to Mod A for BLE and trunk management    Transfers Overall transfer level: Needs assistance Equipment used: Rolling walker (2 wheeled) Transfers: Sit to/from Stand Sit to Stand: From  elevated surface;Mod assist         General transfer comment: Mod verbal cues for sequencing most notably for hand positioning and increased trunk flexion with pt requiring mod A and several rocking attempts to stand  Ambulation/Gait Ambulation/Gait assistance: Min guard Gait Distance (Feet): 2 Feet Assistive device: Rolling walker (2 wheeled) Gait Pattern/deviations: Step-to pattern;Trunk flexed;Shuffle Gait velocity: decreased   General Gait Details: Pt only able to take several very small, shuffling steps at the EOB before needing to return to sitting secondary to fatigue  Stairs            Wheelchair Mobility    Modified Rankin (Stroke Patients Only)       Balance Overall balance assessment: Needs assistance Sitting-balance support: Feet supported;Bilateral upper extremity supported Sitting balance-Leahy Scale: Fair     Standing balance support: Bilateral upper extremity supported;During functional activity Standing balance-Leahy Scale: Fair Standing balance comment: Mod lean on the RW for support                             Pertinent Vitals/Pain Pain Assessment: No/denies pain    Home Living Family/patient expects to be discharged to:: Private residence Living Arrangements: Spouse/significant other Available Help at Discharge: Family;Available 24 hours/day Type of Home: House Home Access: Stairs to enter Entrance Stairs-Rails: Left Entrance Stairs-Number of Steps: 2 Home Layout: One level Home Equipment: Walker - 2 wheels;Bedside commode      Prior Function Level of Independence: Needs assistance   Gait / Transfers Assistance Needed: Mod Ind amb limited community distances with a RW, no fall history  ADL's / Homemaking Assistance Needed: Daughter assists with bathing and dressing, mostly sponge bathes        Hand Dominance        Extremity/Trunk Assessment   Upper Extremity Assessment Upper Extremity Assessment: Generalized  weakness    Lower Extremity Assessment Lower Extremity Assessment: Generalized weakness       Communication   Communication: No difficulties  Cognition Arousal/Alertness: Awake/alert Behavior During Therapy: WFL for tasks assessed/performed Overall Cognitive Status: Within Functional Limits for tasks assessed                                        General Comments      Exercises Total Joint Exercises Hip ABduction/ADduction: AAROM;Both;5 reps Straight Leg Raises: AAROM;Both;5 reps Other Exercises Other Exercises: Bed mobility training with rolling left/right and sup to/from sit Other Exercises: Anterior weight shifting in sitting to facilitate improved transfer performance   Assessment/Plan    PT Assessment Patient needs continued PT services  PT Problem List Decreased strength;Decreased activity tolerance;Decreased balance;Decreased mobility;Decreased knowledge of use of DME       PT Treatment Interventions DME instruction;Gait training;Stair training;Functional mobility training;Therapeutic activities;Therapeutic exercise;Balance training;Patient/family education    PT Goals (Current goals can be found in the Care Plan section)  Acute Rehab PT Goals Patient Stated Goal: To return home to husband PT Goal Formulation: With patient Time For Goal Achievement: 01/05/21 Potential to Achieve Goals: Fair    Frequency Min 2X/week   Barriers to discharge Inaccessible home environment      Co-evaluation               AM-PAC PT "6 Clicks" Mobility  Outcome Measure Help needed turning from your back to your side while in a flat bed without using bedrails?: A Lot Help needed moving from lying on your back to sitting on the side of a flat bed without using bedrails?: A Lot Help needed moving to and from a bed to a chair (including a wheelchair)?: A Lot Help needed standing up from a chair using your arms (e.g., wheelchair or bedside chair)?: A  Lot Help needed to walk in hospital room?: Total Help needed climbing 3-5 steps with a railing? : Total 6 Click Score: 10    End of Session Equipment Utilized During Treatment: Gait belt Activity Tolerance: Patient tolerated treatment well Patient left: in bed;with call bell/phone within reach;with nursing/sitter in room;Other (comment) (Pt left with staff for bathing) Nurse Communication: Mobility status PT Visit Diagnosis: Unsteadiness on feet (R26.81);Muscle weakness (generalized) (M62.81);Difficulty in walking, not elsewhere classified (R26.2)    Time: 5366-4403 PT Time Calculation (min) (ACUTE ONLY): 47 min   Charges:   PT Evaluation $PT Eval Moderate Complexity: 1 Mod PT Treatments $Therapeutic Activity: 23-37 mins        D. Scott Kahner Yanik PT, DPT 12/23/20, 11:08 AM

## 2020-12-23 NOTE — Progress Notes (Addendum)
[place] [facility] [place] NAME: Sheri Butler    MR#:  [id number]  DATE OF BIRTH:  1946/04/27  SUBJECTIVE:  patient awake alert oriented times three. Told me her family got worried she was not acting her usual self brought her to the emergency room.   she ate all her eggs and drank juice for BF Wants to go home. Willing to work with PT No sob or cp sats 100% on RA REVIEW OF SYSTEMS:   Review of Systems  Constitutional: Negative for chills, fever and weight loss.  HENT: Negative for ear discharge, ear pain and nosebleeds.   Eyes: Negative for blurred vision, pain and discharge.  Respiratory: Negative for sputum production, shortness of breath, wheezing and stridor.   Cardiovascular: Negative for chest pain, palpitations, orthopnea and PND.  Gastrointestinal: Negative for abdominal pain, diarrhea, nausea and vomiting.  Genitourinary: Negative for frequency and urgency.  Musculoskeletal: Negative for back pain and joint pain.  Neurological: Positive for weakness. Negative for sensory change, speech change and focal weakness.  Psychiatric/Behavioral: Negative for depression and hallucinations. The patient is not nervous/anxious.    Tolerating Diet:little Tolerating PT: recs rehab  DRUG ALLERGIES:   Allergies  Allergen Reactions  . Sulfa Antibiotics Anaphylaxis    VITALS:  Blood pressure 100/66, pulse 63, temperature 98.3 F (36.8 C), temperature source Oral, resp. rate 18, height 5' (1.524 m), weight 72.6 kg, SpO2 100 %.  PHYSICAL EXAMINATION:   Physical Exam  GENERAL:  75 y.o.-year-old patient lying in the bed with no acute distress. Obese HEENT: Head atraumatic, normocephalic. Oropharynx and nasopharynx clear.  NECK:  Supple, no jugular venous distention. No thyroid enlargement, no tenderness.  LUNGS: Normal breath sounds bilaterally, no wheezing, rales, rhonchi. No use of accessory muscles of respiration.  CARDIOVASCULAR: S1, S2  normal. No murmurs, rubs, or gallops.  ABDOMEN: Soft, nontender, nondistended. Bowel sounds present. No organomegaly or mass.  EXTREMITIES: No cyanosis, clubbing or edema b/l.    NEUROLOGIC: Cranial nerves II through XII are intact. No focal Motor or sensory deficits b/l.   PSYCHIATRIC:  patient is alert and oriented x 2.  SKIN: No obvious rash, lesion, or ulcer.   LABORATORY PANEL:  CBC Recent Labs  Lab 12/23/20 0553  WBC 6.5  HGB 8.3*  HCT 25.8*  PLT 116*    Chemistries  Recent Labs  Lab 12/23/20 0553  NA 142  K 3.8  CL 111  CO2 18*  GLUCOSE 157*  BUN 36*  CREATININE 1.54*  CALCIUM 7.6*  AST 85*  ALT 31  ALKPHOS 40  BILITOT 0.6   Cardiac Enzymes No results for input(s): TROPONINI in the last 168 hours. RADIOLOGY:  CT ABDOMEN PELVIS WO CONTRAST  Result Date: 12/21/2020 CLINICAL DATA:  Abdomen pain fever EXAM: CT ABDOMEN AND PELVIS WITHOUT CONTRAST TECHNIQUE: Multidetector CT imaging of the abdomen and pelvis was performed following the standard protocol without IV contrast. COMPARISON:  CT 03/19/2018 FINDINGS: Lower chest: Lung bases demonstrate scattered foci of ground-glass density suspicious for pneumonia. No pleural effusion. Borderline cardiomegaly. Hepatobiliary: Hepatic steatosis. Multiple calcified gallstones. No biliary dilatation Pancreas: Unremarkable. No pancreatic ductal dilatation or surrounding inflammatory changes. Spleen: Normal in size without focal abnormality. Adrenals/Urinary Tract: Adrenal glands are within normal limits. Small stones within the right kidney. Subcentimeter exophytic lesion off the posterior lower pole right kidney too small to further characterize. Apparent 5 by 7 mm calcification in the region of proximal right ureter about 2 cm distal  to the right UPJ without significant upstream obstruction. The bladder is unremarkable. Stomach/Bowel: The stomach is nonenlarged. No dilated small bowel. Status post right hemicolectomy with anastomosis  in the right anterior upper abdomen. No evidence for a bowel obstruction. Sigmoid colon diverticular disease without acute inflammatory change. Vascular/Lymphatic: Mild aortic atherosclerosis. No aneurysm. No suspicious nodes. Reproductive: Uterus and bilateral adnexa are unremarkable. Other: Negative for free air or free fluid. Subcutaneous scarring in the left anterior abdominal wall. Musculoskeletal: No acute or significant osseous findings. Degenerative changes of the spine IMPRESSION: 1. Multiple foci of ground-glass density at the bilateral lung bases suspicious for multifocal pneumonia, potential viral pneumonia. 2. Status post right hemicolectomy with anastomosis in the right anterior upper abdomen. No evidence for a bowel obstruction. Sigmoid colon diverticular disease without acute inflammatory change. 3. Apparent 5 x 7 mm calcification in the region of the proximal right ureter without significant upstream obstruction. There are additional intrarenal stones on the right. 4. Hepatic steatosis. Gallstones. Aortic Atherosclerosis (ICD10-I70.0). Electronically Signed   By: Donavan Foil M.D.   On: 12/21/2020 22:57   DG Chest 1 View  Result Date: 12/21/2020 CLINICAL DATA:  Fever. EXAM: CHEST  1 VIEW COMPARISON:  None. FINDINGS: Very mild linear atelectasis is seen within the lateral aspect of the left lung base. There is no evidence of an acute infiltrate, pleural effusion or pneumothorax. The cardiac silhouette is mildly enlarged. Mild prominence of the right hilar region is seen. Degenerative changes are seen within the mid and lower thoracic spine. IMPRESSION: 1. Very mild left basilar linear atelectasis. 2. Mild right hilar prominence which may be vascular in origin. Correlation with nonemergent chest CT is recommended. Electronically Signed   By: Virgina Norfolk M.D.   On: 12/21/2020 21:38   ASSESSMENT AND PLAN:  Sheri Butler  is a 75 y.o. African-American female with a known history of  hypertension and dyslipidemia, who presented to the emergency room by her children with acute onset of altered mental status.  According to her children she was not able to state what she is and the patient was saying that their children "worry about everything".  The patient admitted to loss of smell  Acute encephalopathy and multifocal COVID 19 pneumonia with subsequent severe sepsis due to COVID-19 and UTI--POA --fever, tachycardia and tachypnea in the setting of pneumonia Acute renal failure to meet sepsis criteria and elevated lactic acid level of 3.3 to meet severe sepsis criteria. -Her mental status has improved given improvement of her fever. - on IV remdesivir day 2/3 and Solu-Medrol. -  blood culture  Neg  --UA positive for UTI, UC pending - lactic acid trending down to 1.1 --BNP 71 --CRP 9.8 --IV rocephin for UTI -- overall improving and feeling well. Some weakness   Acute kidney injury with metabolic acidosis -- BaselineCreatinine 0.49 (2019) -  likely prerenal due to volume depletion and dehydration. -Came in with creatinine of 1.8--- 1.68--1.5 - We will hold off any nephrotoxins. -- received IV fluids  Hypertensive urgency that could be contributing to her encephalopathy bedside fever and COVID-19. -placed on as needed IV labetalol and will continue atenolol and hold off Cozaar. --BP much better  Dyslipidemia. - continue statins    History of tremors. - pt not on any meds for it   DVT prophylaxis. - SubQ Lovenox.   PT/OT to see pt-- uses Walker at home. Patient lives with husband, son and daughter-in-law at home.PT recommends rehab--pt wants to go home   CODE STATUS:  Full code  Status is: Inpatient  Remains inpatient appropriate because:Altered mental status, Ongoing diagnostic testing needed not appropriate for outpatient work up, Unsafe d/c plan, IV treatments appropriate due to intensity of illness or inability to take PO and Inpatient level of care  appropriate due to severity of illness   Dispo: The patient is from: Home  Anticipated d/c is to: Home  Anticipated d/c date is: 1-2days  Patient currently is medically improving.  Discussed with patient son Marisue Ivan and will d/c her tomorrow with HHPT after 3rd dose of antiviral         TOTAL TIME TAKING CARE OF THIS PATIENT: *25* minutes.  >50% time spent on counselling and coordination of care  Note: This dictation was prepared with Dragon dictation along with smaller phrase technology. Any transcriptional errors that result from this process are unintentional.  Fritzi Mandes M.D    [facility]   CC: Primary care physician; Denton Lank, MDPatient ID: Sheri Butler, female   DOB: 12-11-1945, 75 y.o.   MRN: [id number]

## 2020-12-23 NOTE — TOC Initial Note (Signed)
Transition of Care [facility]) - Initial/Assessment Note    Patient Details  Name: FREDONIA CASALINO MRN: [id number] Date of Birth: 11-27-1946  [facility]) CM/SW Contact:    Boris Sharper, LCSW Phone Number: 12/23/2020, 4:35 PM  Clinical Narrative:                 CSW spoke with pt regarding discharge plans and PT recommendations. CSW explained the recommendations and pt was not agreeable to SNF and rather go home with [facility]. CSW spoke with pt's son and he was in agreement with pt going home with [facility]. CSW was unable to secure [facility] due to agency staffing issues and PT services being delayed. TOC please follow up on [facility] for pt.  Expected Discharge Plan: [place] Barriers to Discharge: Continued Medical Work up   Patient Goals and CMS Choice   CMS Medicare.gov Compare Post Acute Care list provided to:: Patient Choice offered to / list presented to : Patient  Expected Discharge Plan and Services Expected Discharge Plan: [place]       Living arrangements for the past 2 months: Single Family Home                                      Prior Living Arrangements/Services Living arrangements for the past 2 months: Single Family Home Lives with:: Self Patient language and need for interpreter reviewed:: Yes Do you feel safe going back to the place where you live?: Yes      Need for Family Participation in Patient Care: Yes (Comment) Care giver support system in place?: Yes (comment)   Criminal Activity/Legal Involvement Pertinent to Current Situation/Hospitalization: No - Comment as needed  Activities of Daily Living Home Assistive Devices/Equipment: Walker (specify type) ADL Screening (condition at time of admission) Patient's cognitive ability adequate to safely complete daily activities?: Yes Is the patient deaf or have difficulty hearing?: Yes Does the patient have difficulty seeing, even when wearing glasses/contacts?: No Does  the patient have difficulty concentrating, remembering, or making decisions?: No Patient able to express need for assistance with ADLs?: Yes Does the patient have difficulty dressing or bathing?: Yes Independently performs ADLs?: No Communication: Independent Dressing (OT): Needs assistance Is this a change from baseline?: Pre-admission baseline Grooming: Needs assistance Is this a change from baseline?: Pre-admission baseline Feeding: Independent Bathing: Needs assistance Is this a change from baseline?: Pre-admission baseline Toileting: Needs assistance Is this a change from baseline?: Pre-admission baseline In/Out Bed: Needs assistance Is this a change from baseline?: Pre-admission baseline Walks in Home: Independent with device (comment) Does the patient have difficulty walking or climbing stairs?: Yes Weakness of Legs: Both Weakness of Arms/Hands: None  Permission Sought/Granted Permission sought to share information with : Facility Art therapist granted to share information with : Yes, Verbal Permission Granted  Share Information with NAME: Levada Dy     Permission granted to share info w Relationship: son  Permission granted to share info w Contact Information: [phone]  Emotional Assessment Appearance:: Other (Comment Required (unable to address) Attitude/Demeanor/Rapport: Unable to Assess Affect (typically observed): Unable to Assess Orientation: : Oriented to Self,Oriented to Place,Oriented to  Time,Oriented to Situation Alcohol / Substance Use: Not Applicable Psych Involvement: No (comment)  Admission diagnosis:  AKI (acute kidney injury) ([place]) [N17.9] Sepsis ([place]) [A41.9] Encephalopathy due to COVID-19 virus [U07.1, G93.49] Patient Active Problem List  Diagnosis Date Noted  . Encephalopathy due to COVID-19 virus 12/22/2020  . Sepsis with acute organ dysfunction ([place])   . Pneumonia due to COVID-19 virus   . GI bleed 10/21/2017  .  Protein-calorie malnutrition, severe 11/10/2015  . AKI (acute kidney injury) ([place]) 11/08/2015   PCP:  Denton Lank, MD Pharmacy:   [facility] [address] [place] [address] Phone: [phone] Fax: [phone]     Social Determinants of Health (SDOH) Interventions    Readmission Risk Interventions No flowsheet data found.

## 2020-12-24 DIAGNOSIS — U071 COVID-19: Secondary | ICD-10-CM | POA: Diagnosis not present

## 2020-12-24 DIAGNOSIS — J1282 Pneumonia due to coronavirus disease 2019: Secondary | ICD-10-CM | POA: Diagnosis not present

## 2020-12-24 DIAGNOSIS — N179 Acute kidney failure, unspecified: Secondary | ICD-10-CM | POA: Diagnosis not present

## 2020-12-24 DIAGNOSIS — G9349 Other encephalopathy: Secondary | ICD-10-CM | POA: Diagnosis not present

## 2020-12-24 LAB — URINE CULTURE

## 2020-12-24 MED ORDER — VITAMIN D3 25 MCG PO TABS: 1000.0000 [IU] | ORAL_TABLET | Freq: Every day | ORAL | 0 refills | Status: AC

## 2020-12-24 MED ORDER — PREDNISONE 10 MG PO TABS: ORAL_TABLET | ORAL | 0 refills | Status: DC

## 2020-12-24 MED ORDER — CEPHALEXIN 500 MG PO CAPS
500.0000 mg | ORAL_CAPSULE | Freq: Three times a day (TID) | ORAL | Status: DC
  Administered 2020-12-24: 500 mg via ORAL
  Filled 2020-12-24: qty 1

## 2020-12-24 MED ORDER — GUAIFENESIN-DM 100-10 MG/5ML PO SYRP: 10.0000 mL | ORAL_SOLUTION | ORAL | 0 refills | Status: DC | PRN

## 2020-12-24 MED ORDER — CEPHALEXIN 500 MG PO CAPS: 500.0000 mg | ORAL_CAPSULE | Freq: Three times a day (TID) | ORAL | 0 refills | Status: AC

## 2020-12-24 MED ORDER — PRAVASTATIN SODIUM 20 MG PO TABS: 20.0000 mg | ORAL_TABLET | Freq: Every day | ORAL | 0 refills | Status: DC

## 2020-12-24 MED ORDER — PREDNISONE 50 MG PO TABS: 50.0000 mg | ORAL_TABLET | Freq: Every day | ORAL | Status: DC

## 2020-12-24 NOTE — Plan of Care (Signed)
  Problem: Education: Goal: Knowledge of General Education information will improve Description: Including pain rating scale, medication(s)/side effects and non-pharmacologic comfort measures Outcome: Progressing   Problem: Education: Goal: Knowledge of risk factors and measures for prevention of condition will improve Outcome: Progressing   Problem: Respiratory: Goal: Will maintain a patent airway Outcome: Progressing Goal: Complications related to the disease process, condition or treatment will be avoided or minimized Outcome: Progressing   Problem: Urinary Elimination: Goal: Signs and symptoms of infection will decrease Outcome: Progressing

## 2020-12-24 NOTE — TOC Transition Note (Signed)
Transition of Care [facility]) - CM/SW Discharge Note   Patient Details  Name: Sheri Butler MRN: [id number] Date of Birth: November 09, 1946  Transition of Care [facility]) CM/SW Contact:  Shelbie Hutching, RN Phone Number: 12/24/2020, 2:24 PM   Clinical Narrative:    Patient discharged home with son.  [facility] agreed to accept patient for PT but they cannot see the patient until 1/21.  No other agency has availability and is willing to accept patient for services.      Final next level of care: Home w Home Health Services Barriers to Discharge: Barriers Resolved   Patient Goals and CMS Choice   CMS Medicare.gov Compare Post Acute Care list provided to:: Patient Choice offered to / list presented to : Patient  Discharge Placement                       Discharge Plan and Services                          HH Arranged: PT [facility] Agency: [place] Date [facility] Agency Contacted: 12/24/20 Time [place]: [phone] Representative spoke with at [place]: [place] ([place]) Interventions     Readmission Risk Interventions No flowsheet data found.

## 2020-12-24 NOTE — Discharge Summary (Signed)
[facility] NAME: Natalyah Cummiskey    MR#:  [id number]  DATE OF BIRTH:  [birth year]  DATE OF ADMISSION:  12/21/2020 ADMITTING PHYSICIAN: Christel Mormon, MD  DATE OF DISCHARGE: 12/24/2020  PRIMARY CARE PHYSICIAN: Denton Lank, MD    ADMISSION DIAGNOSIS:  AKI (acute kidney injury) ([place]) [N17.9] Sepsis ([place]) [A41.9] Encephalopathy due to COVID-19 virus [U07.1, G93.49]  DISCHARGE DIAGNOSIS:  Acute Encephalopathy due to UTI and COVID-19 PNA with Severe sepsis--improved  SECONDARY DIAGNOSIS:   Past Medical History:  Diagnosis Date  . Hyperlipidemia   . Hypertension     HOSPITAL COURSE:   CarolynWrightis a75 y.o.African-American femalewith a known history of hypertension and dyslipidemia, who presented to the emergency room by her children with acute onset of altered mental status. According to her children she was not able to state what she is and the patient was saying that their children "worry about everything". The patient admitted to loss of smell  Acute encephalopathy and multifocal COVID 19 pneumonia with subsequent severe sepsis due to COVID-19 and UTI--POA --fever, tachycardia and tachypnea in the setting of pneumonia Acute renal failure to meet sepsis criteria and elevated lactic acid level of 3.3 to meet severe sepsis criteria. -Her mental status has improved given improvement of her fever. -on IV remdesivir day 3/3 and Solu-Medrol--change to po taper - blood culture  Neg  --UA positive for UTI, UC pending at discharge -lactic acid trending down to 1.1 --BNP 71 --CRP 9.8 --IV rocephin for UTI--change to po Keflex -- overall improving and feeling well. Some weakness -- CXR Very mild linear atelectasis is seen within the lateral aspect of the left lung base. There is no evidence of an acute infiltrate, pleural effusion or pneumothorax -sats >92% onRA no resp distress  Acute kidney injury with metabolic  acidosis -- BaselineCreatinine 0.49 (2019) - likely prerenal due to volume depletion and dehydration. -Came in with creatinine of 1.8--- 1.68--1.5 -We will hold off any nephrotoxins. -- received IV fluids  Hypertensive urgencythat could be contributing to her encephalopathy bedside fever and COVID-19. -placed on as needed IV labetalol and will continue atenolol and d/ced Cozaar--bp soft --BP much better  Dyslipidemia. -continue statins  DVT prophylaxis. -SubQ Lovenox.   PT/OT to see pt-- uses [facility]. Patient lives with husband, son and daughter-in-law at home.PT recommends rehab--pt wants to go home   CODE STATUS: Full code  Status is: Inpatient  Remains inpatient appropriate because:Altered mental status, Ongoing diagnostic testing needed not appropriate for outpatient work up, Unsafe d/c plan, IV treatments appropriate due to intensity of illness or inability to take PO and Inpatient level of care appropriate due to severity of illness   Dispo: The patient is from:Home Anticipated d/c is WJ:XBJY Anticipated d/c date is: 1-2days Patient currently is medically improving.  Discussed with patient son Marisue Ivan and will d/c her today with HHPT after 3rd dose of antiviral pt does not want to go t rehab.    CONSULTS OBTAINED:    DRUG ALLERGIES:   Allergies  Allergen Reactions  . Sulfa Antibiotics Anaphylaxis    DISCHARGE MEDICATIONS:   Allergies as of 12/24/2020      Reactions   Sulfa Antibiotics Anaphylaxis      Medication List    STOP taking these medications   ibuprofen 800 MG tablet Commonly known as: ADVIL   losartan 25 MG tablet Commonly known as: COZAAR     TAKE these medications  aspirin EC 81 MG tablet Take 81 mg by mouth daily.   atenolol 25 MG tablet Commonly known as: TENORMIN Take 25 mg by mouth daily.   cephALEXin 500 MG capsule Commonly known as: KEFLEX Take 1  capsule (500 mg total) by mouth 3 (three) times daily for 5 days.   folic acid 1 MG tablet Commonly known as: FOLVITE Take 1 mg by mouth daily.   guaiFENesin-dextromethorphan 100-10 MG/5ML syrup Commonly known as: ROBITUSSIN DM Take 10 mLs by mouth every 4 (four) hours as needed for cough.   pravastatin 20 MG tablet Commonly known as: PRAVACHOL Take 1 tablet (20 mg total) by mouth daily at 6 PM. What changed:   medication strength  how much to take  when to take this   predniSONE 10 MG tablet Commonly known as: DELTASONE Take 50 mg daily--taper by 10 mg daily then stop Start taking on: December 25, 2020   Vitamin D3 25 MCG tablet Commonly known as: Vitamin D Take 1 tablet (1,000 Units total) by mouth daily.       If you experience worsening of your admission symptoms, develop shortness of breath, life threatening emergency, suicidal or homicidal thoughts you must seek medical attention immediately by calling 911 or calling your MD immediately  if symptoms less severe.  You Must read complete instructions/literature along with all the possible adverse reactions/side effects for all the Medicines you take and that have been prescribed to you. Take any new Medicines after you have completely understood and accept all the possible adverse reactions/side effects.   Please note  You were cared for by a hospitalist during your hospital stay. If you have any questions about your discharge medications or the care you received while you were in the hospital after you are discharged, you can call the unit and asked to speak with the hospitalist on call if the hospitalist that took care of you is not available. Once you are discharged, your primary care physician will handle any further medical issues. Please note that NO REFILLS for any discharge medications will be authorized once you are discharged, as it is imperative that you return to your primary care physician (or establish a  relationship with a primary care physician if you do not have one) for your aftercare needs so that they can reassess your need for medications and monitor your lab values. Today   SUBJECTIVE   Overall improving and doing well. Wants to go home  VITAL SIGNS:  Blood pressure (!) 100/58, pulse 63, temperature 98 F (36.7 C), resp. rate 18, height 5' (1.524 m), weight 72.6 kg, SpO2 100 %.  I/O:    Intake/Output Summary (Last 24 hours) at 12/24/2020 0834 Last data filed at 12/24/2020 0400 Gross per 24 hour  Intake 200 ml  Output -  Net 200 ml    PHYSICAL EXAMINATION:  GENERAL:  75 y.o.-year-old patient lying in the bed with no acute distress. Obese EYES: Pupils equal, round, reactive to light and accommodation. No scleral icterus.  HEENT: Head atraumatic, normocephalic. Oropharynx and nasopharynx clear.   LUNGS: Normal breath sounds bilaterally, no wheezing, rales,rhonchi or crepitation. No use of accessory muscles of respiration.  CARDIOVASCULAR: S1, S2 normal. No murmurs, rubs, or gallops.  ABDOMEN: Soft, non-tender, non-distended. Bowel sounds present. No organomegaly or mass. obese EXTREMITIES: No pedal edema, cyanosis, or clubbing.  NEUROLOGIC: Cranial nerves II through XII are intact. Muscle strength 5/5 in all extremities. Sensation intact. Gait not checked.  PSYCHIATRIC: The patient is  alert and oriented x 3.  SKIN: No obvious rash, lesion, or ulcer.   DATA REVIEW:   CBC  Recent Labs  Lab 12/23/20 0553  WBC 6.5  HGB 8.3*  HCT 25.8*  PLT 116*    Chemistries  Recent Labs  Lab 12/23/20 0553  NA 142  K 3.8  CL 111  CO2 18*  GLUCOSE 157*  BUN 36*  CREATININE 1.54*  CALCIUM 7.6*  AST 85*  ALT 31  ALKPHOS 40  BILITOT 0.6    Microbiology Results   Recent Results (from the past 240 hour(s))  Culture, blood (Routine x 2)     Status: None (Preliminary result)   Collection Time: 12/21/20  8:48 PM   Specimen: BLOOD  Result Value Ref Range Status   Specimen  Description BLOOD BLOOD LEFT HAND  Final   Special Requests   Final    BOTTLES DRAWN AEROBIC ONLY Blood Culture results may not be optimal due to an inadequate volume of blood received in culture bottles   Culture   Final    NO GROWTH 3 DAYS Performed at [facility], [address], [place], [place] [address]    Report Status PENDING  Incomplete  Culture, blood (Routine x 2)     Status: None (Preliminary result)   Collection Time: 12/21/20  9:02 PM   Specimen: BLOOD  Result Value Ref Range Status   Specimen Description BLOOD BLOOD RIGHT HAND  Final   Special Requests   Final    BOTTLES DRAWN AEROBIC AND ANAEROBIC Blood Culture adequate volume   Culture   Final    NO GROWTH 3 DAYS Performed at [facility], [address], [place], [place] [address]    Report Status PENDING  Incomplete    RADIOLOGY:  No results found.   CODE STATUS:     Code Status Orders  (From admission, onward)         Start     Ordered   12/22/20 0018  Full code  Continuous        12/22/20 0021        Code Status History    Date Active Date Inactive Code Status Order ID Comments User Context   10/21/2017 0246 10/22/2017 1747 Full Code [id number]  Saundra Shelling, MD Inpatient   11/08/2015 2253 11/12/2015 1500 Full Code [id number]  Hower, Aaron Mose, MD ED   Advance Care Planning Activity       TOTAL TIME TAKING CARE OF THIS PATIENT: *35* minutes.    Fritzi Mandes M.D  [facility]    CC: Primary care physician; Denton Lank, MD

## 2020-12-26 LAB — CULTURE, BLOOD (ROUTINE X 2)
Culture: NO GROWTH
Culture: NO GROWTH
Special Requests: ADEQUATE

## 2021-04-04 ENCOUNTER — Encounter: Payer: Self-pay | Admitting: Emergency Medicine

## 2021-04-04 ENCOUNTER — Emergency Department: Payer: Medicare Other

## 2021-04-04 ENCOUNTER — Observation Stay
Admission: EM | Admit: 2021-04-04 | Discharge: 2021-04-06 | Disposition: A | Discharge status: Home / Self Care | Payer: Medicare Other | Attending: Internal Medicine | Admitting: Internal Medicine

## 2021-04-04 DIAGNOSIS — Z79899 Other long term (current) drug therapy: Secondary | ICD-10-CM | POA: Diagnosis not present

## 2021-04-04 DIAGNOSIS — L03313 Cellulitis of chest wall: Secondary | ICD-10-CM

## 2021-04-04 DIAGNOSIS — Z20822 Contact with and (suspected) exposure to covid-19: Secondary | ICD-10-CM | POA: Diagnosis not present

## 2021-04-04 DIAGNOSIS — N61 Mastitis without abscess: Secondary | ICD-10-CM | POA: Insufficient documentation

## 2021-04-04 DIAGNOSIS — Z7982 Long term (current) use of aspirin: Secondary | ICD-10-CM | POA: Insufficient documentation

## 2021-04-04 DIAGNOSIS — I10 Essential (primary) hypertension: Secondary | ICD-10-CM | POA: Diagnosis not present

## 2021-04-04 DIAGNOSIS — T2101XD Burn of unspecified degree of chest wall, subsequent encounter: Secondary | ICD-10-CM | POA: Diagnosis present

## 2021-04-04 DIAGNOSIS — L089 Local infection of the skin and subcutaneous tissue, unspecified: Secondary | ICD-10-CM

## 2021-04-04 DIAGNOSIS — X12XXXD Contact with other hot fluids, subsequent encounter: Secondary | ICD-10-CM | POA: Insufficient documentation

## 2021-04-04 LAB — COMPREHENSIVE METABOLIC PANEL
ALT: 12 U/L (ref 0–44)
AST: 26 U/L (ref 15–41)
Albumin: 3.4 g/dL — ABNORMAL LOW (ref 3.5–5.0)
Alkaline Phosphatase: 45 U/L (ref 38–126)
Anion gap: 15 (ref 5–15)
BUN: 26 mg/dL — ABNORMAL HIGH (ref 8–23)
CO2: 23 mmol/L (ref 22–32)
Calcium: 9.7 mg/dL (ref 8.9–10.3)
Chloride: 98 mmol/L (ref 98–111)
Creatinine, Ser: 1.27 mg/dL — ABNORMAL HIGH (ref 0.44–1.00)
GFR, Estimated: 44 mL/min — ABNORMAL LOW (ref >60)
Glucose, Bld: 103 mg/dL — ABNORMAL HIGH (ref 70–99)
Potassium: 4.5 mmol/L (ref 3.5–5.1)
Sodium: 136 mmol/L (ref 135–145)
Total Bilirubin: 0.9 mg/dL (ref 0.3–1.2)
Total Protein: 6.8 g/dL (ref 6.5–8.1)

## 2021-04-04 LAB — LACTIC ACID, PLASMA
Lactic Acid, Venous: 2.7 mmol/L (ref 0.5–1.9)
Lactic Acid, Venous: 1.9 mmol/L (ref 0.5–1.9)

## 2021-04-04 LAB — CBC WITH DIFFERENTIAL/PLATELET
Abs Immature Granulocytes: 0.01 10*3/uL (ref 0.00–0.07)
Basophils Absolute: 0.1 10*3/uL (ref 0.0–0.1)
Basophils Relative: 1 %
Eosinophils Absolute: 0.1 10*3/uL (ref 0.0–0.5)
Eosinophils Relative: 1 %
HCT: 31.9 % — ABNORMAL LOW (ref 36.0–46.0)
Hemoglobin: 10.1 g/dL — ABNORMAL LOW (ref 12.0–15.0)
Immature Granulocytes: 0 %
Lymphocytes Relative: 18 %
Lymphs Abs: 1 10*3/uL (ref 0.7–4.0)
MCH: 30.2 pg (ref 26.0–34.0)
MCHC: 31.7 g/dL (ref 30.0–36.0)
MCV: 95.5 fL (ref 80.0–100.0)
Monocytes Absolute: 0.9 10*3/uL (ref 0.1–1.0)
Monocytes Relative: 17 %
Neutro Abs: 3.4 10*3/uL (ref 1.7–7.7)
Neutrophils Relative %: 63 %
Platelets: 202 10*3/uL (ref 150–400)
RBC: 3.34 MIL/uL — ABNORMAL LOW (ref 3.87–5.11)
RDW: 17.5 % — ABNORMAL HIGH (ref 11.5–15.5)
WBC: 5.3 10*3/uL (ref 4.0–10.5)
nRBC: 0 % (ref 0.0–0.2)

## 2021-04-04 LAB — RESP PANEL BY RT-PCR (FLU A&B, COVID) ARPGX2
Influenza A by PCR: NEGATIVE
Influenza B by PCR: NEGATIVE
SARS Coronavirus 2 by RT PCR: NEGATIVE

## 2021-04-04 LAB — PROTIME-INR
INR: 1 (ref 0.8–1.2)
Prothrombin Time: 13.3 seconds (ref 11.4–15.2)

## 2021-04-04 MED ORDER — MORPHINE SULFATE (PF) 2 MG/ML IV SOLN
2.0000 mg | INTRAVENOUS | Status: DC | PRN
  Administered 2021-04-05: 2 mg via INTRAVENOUS
  Filled 2021-04-04: qty 1

## 2021-04-04 MED ORDER — SODIUM CHLORIDE 0.9 % IV SOLN: INTRAVENOUS | Status: AC

## 2021-04-04 MED ORDER — ONDANSETRON HCL 4 MG/2ML IJ SOLN
4.0000 mg | Freq: Once | INTRAMUSCULAR | Status: AC
  Administered 2021-04-04: 4 mg via INTRAVENOUS
  Filled 2021-04-04: qty 2

## 2021-04-04 MED ORDER — VANCOMYCIN HCL IN DEXTROSE 1-5 GM/200ML-% IV SOLN
1000.0000 mg | Freq: Once | INTRAVENOUS | Status: AC
  Administered 2021-04-04: 1000 mg via INTRAVENOUS
  Filled 2021-04-04: qty 200

## 2021-04-04 MED ORDER — ACETAMINOPHEN 650 MG RE SUPP: 650.0000 mg | Freq: Four times a day (QID) | RECTAL | Status: DC | PRN

## 2021-04-04 MED ORDER — ACETAMINOPHEN 325 MG PO TABS: 650.0000 mg | ORAL_TABLET | Freq: Four times a day (QID) | ORAL | Status: DC | PRN

## 2021-04-04 MED ORDER — SODIUM CHLORIDE 0.9 % IV SOLN
1.0000 g | INTRAVENOUS | Status: DC
  Administered 2021-04-05: 1 g via INTRAVENOUS
  Filled 2021-04-04 (×2): qty 10

## 2021-04-04 MED ORDER — ONDANSETRON HCL 4 MG/2ML IJ SOLN: 4.0000 mg | Freq: Four times a day (QID) | INTRAMUSCULAR | Status: DC | PRN

## 2021-04-04 MED ORDER — MORPHINE SULFATE (PF) 4 MG/ML IV SOLN
4.0000 mg | Freq: Once | INTRAVENOUS | Status: AC
  Administered 2021-04-04: 4 mg via INTRAVENOUS
  Filled 2021-04-04: qty 1

## 2021-04-04 MED ORDER — HYDROCODONE-ACETAMINOPHEN 5-325 MG PO TABS
1.0000 | ORAL_TABLET | ORAL | Status: DC | PRN
  Administered 2021-04-05: 1 via ORAL
  Filled 2021-04-04: qty 1

## 2021-04-04 MED ORDER — ONDANSETRON HCL 4 MG PO TABS: 4.0000 mg | ORAL_TABLET | Freq: Four times a day (QID) | ORAL | Status: DC | PRN

## 2021-04-04 MED ORDER — VANCOMYCIN HCL 1250 MG/250ML IV SOLN: 1250.0000 mg | INTRAVENOUS | Status: DC

## 2021-04-04 MED ORDER — ENOXAPARIN SODIUM 40 MG/0.4ML IJ SOSY
40.0000 mg | PREFILLED_SYRINGE | INTRAMUSCULAR | Status: DC
  Administered 2021-04-05 (×2): 40 mg via SUBCUTANEOUS
  Filled 2021-04-04 (×3): qty 0.4

## 2021-04-04 MED ORDER — ONDANSETRON 4 MG PO TBDP: 4.0000 mg | ORAL_TABLET | Freq: Once | ORAL | Status: DC

## 2021-04-04 MED ORDER — SODIUM CHLORIDE 0.9 % IV BOLUS
1000.0000 mL | Freq: Once | INTRAVENOUS | Status: AC
  Administered 2021-04-04: 1000 mL via INTRAVENOUS
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
## 2021-04-04 NOTE — ED Notes (Signed)
IV team consult placed, IV team called and stated there is no IV team available at [facility] until 7am. Primary and Charge RN notified.

## 2021-04-04 NOTE — ED Notes (Signed)
Area on right breast was cleaned with normal saline and dressed according to orders. Patient tolerated procedure well.

## 2021-04-04 NOTE — ED Notes (Signed)
Dr. Kerman Passey aware of lactic acid of 2.7 and that the phlebotomist could not draw the labs.
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
## 2021-04-05 ENCOUNTER — Other Ambulatory Visit: Payer: Self-pay

## 2021-04-05 DIAGNOSIS — N61 Mastitis without abscess: Secondary | ICD-10-CM | POA: Diagnosis not present

## 2021-04-05 DIAGNOSIS — T2101XD Burn of unspecified degree of chest wall, subsequent encounter: Secondary | ICD-10-CM | POA: Diagnosis not present

## 2021-04-05 LAB — CREATININE, SERUM
Creatinine, Ser: 1.06 mg/dL — ABNORMAL HIGH (ref 0.44–1.00)
GFR, Estimated: 55 mL/min — ABNORMAL LOW (ref >60)

## 2021-04-05 MED ORDER — COLLAGENASE 250 UNIT/GM EX OINT
TOPICAL_OINTMENT | Freq: Every day | CUTANEOUS | Status: DC
  Administered 2021-04-06: 1 via TOPICAL
  Filled 2021-04-05 (×2): qty 30

## 2021-04-05 MED ORDER — AMOXICILLIN-POT CLAVULANATE 875-125 MG PO TABS
1.0000 | ORAL_TABLET | Freq: Two times a day (BID) | ORAL | Status: DC
  Administered 2021-04-05 – 2021-04-06 (×3): 1 via ORAL
  Filled 2021-04-05 (×3): qty 1

## 2021-04-05 MED ORDER — DOXYCYCLINE HYCLATE 100 MG PO TABS
100.0000 mg | ORAL_TABLET | Freq: Two times a day (BID) | ORAL | Status: DC
  Administered 2021-04-05 – 2021-04-06 (×3): 100 mg via ORAL
  Filled 2021-04-05 (×3): qty 1

## 2021-04-05 MED ORDER — AMOXICILLIN-POT CLAVULANATE 875-125 MG PO TABS: 1.0000 | ORAL_TABLET | Freq: Two times a day (BID) | ORAL | 0 refills | Status: AC

## 2021-04-05 MED ORDER — HYDROCODONE-ACETAMINOPHEN 5-325 MG PO TABS
1.0000 | ORAL_TABLET | Freq: Four times a day (QID) | ORAL | Status: DC | PRN
Start: 2021-04-05 — End: 2021-04-06
  Administered 2021-04-05 (×2): 1 via ORAL
  Filled 2021-04-05 (×2): qty 1

## 2021-04-05 MED ORDER — DOXYCYCLINE HYCLATE 100 MG PO TABS: 100.0000 mg | ORAL_TABLET | Freq: Two times a day (BID) | ORAL | 0 refills | Status: AC

## 2021-04-05 MED ORDER — COLLAGENASE 250 UNIT/GM EX OINT: TOPICAL_OINTMENT | Freq: Every day | CUTANEOUS | 2 refills | Status: AC

## 2021-04-05 NOTE — Care Management CC44 (Signed)
Condition Code 44 Documentation Completed  Patient Details  Name: Sheri Butler MRN: [id number] Date of Birth: [birth year]   Condition Code 44 given:  Yes Patient signature on Condition Code 44 notice:  Yes Documentation of 2 MD's agreement:  Yes Code 44 added to claim:  Yes    Su Hilt, RN 04/05/2021, 1:24 PM
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
## 2021-04-05 NOTE — Plan of Care (Signed)
  Problem: Health Behavior/Discharge Planning: Goal: Ability to manage health-related needs will improve Outcome: Not Progressing   
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
## 2021-04-06 DIAGNOSIS — N61 Mastitis without abscess: Secondary | ICD-10-CM

## 2021-04-06 DIAGNOSIS — T2101XD Burn of unspecified degree of chest wall, subsequent encounter: Secondary | ICD-10-CM | POA: Diagnosis not present

## 2021-04-06 MED ORDER — ATENOLOL 25 MG PO TABS
25.0000 mg | ORAL_TABLET | Freq: Every day | ORAL | Status: DC
  Administered 2021-04-06: 25 mg via ORAL
  Filled 2021-04-06: qty 1

## 2021-04-06 NOTE — Progress Notes (Signed)
Patient was formally discharged from the hospital yesterday, 4/29, but was unable to leave due to requirement for ambulance transportation.  Patient seen and examined at bedside this AM.  She is clinically stable for discharge as planned.   See Discharge Summary by Dr. Billie Ruddy of 4/29   No charge.

## 2021-04-06 NOTE — Plan of Care (Signed)
Pt discharged home per MD orders at this time.All discharge instructions,education and medications reviewed with patient at bedside.Pt expressed understanding and will comply with discharge instructions.Pt discharged home with [facility]PT services.Pt transported by [facility] personnel on a stretcher.no verbal c/o or any ssx of distress at this time.

## 2021-04-09 LAB — CULTURE, BLOOD (ROUTINE X 2)
Culture: NO GROWTH
Special Requests: ADEQUATE

## 2021-04-09 NOTE — TOC Progression Note (Signed)
Transition of Care [facility]) - Progression Note    Patient Details  Name: Sheri Butler MRN: [id number] Date of Birth: 18-Oct-1946  Transition of Care [facility]) CM/SW [place], [place] Phone Number: [phone] 04/09/2021, 11:44 AM  Clinical Narrative:     CSW received voicemail fro Lillette Boxer RN from [facility] Triage with a request for patient update.  CSW updated Gwenlyn Fudge on patient status and home health agency services.   Expected Discharge Plan: [place] Barriers to Discharge: Barriers Resolved  Expected Discharge Plan and Services Expected Discharge Plan: [place]   Discharge Planning Services: CM Consult   Living arrangements for the past 2 months: Single Family Home Expected Discharge Date: 04/06/21               DME Arranged: N/A         HH Arranged: PT,RN HH Agency: [place] Date [place]: 04/05/21 Time [place]: [id number] Representative spoke with at [place]: [place] (Young) Interventions    Readmission Risk Interventions No flowsheet data found.

## 2022-03-20 ENCOUNTER — Emergency Department
Admission: EM | Admit: 2022-03-20 | Discharge: 2022-03-20 | Disposition: A | Discharge status: Home / Self Care | Payer: Medicare Other | Attending: Emergency Medicine | Admitting: Emergency Medicine

## 2022-03-20 ENCOUNTER — Other Ambulatory Visit: Payer: Self-pay

## 2022-03-20 ENCOUNTER — Encounter: Payer: Self-pay | Admitting: Emergency Medicine

## 2022-03-20 DIAGNOSIS — Z8616 Personal history of COVID-19: Secondary | ICD-10-CM | POA: Insufficient documentation

## 2022-03-20 DIAGNOSIS — I10 Essential (primary) hypertension: Secondary | ICD-10-CM | POA: Diagnosis not present

## 2022-03-20 DIAGNOSIS — Z7982 Long term (current) use of aspirin: Secondary | ICD-10-CM | POA: Diagnosis not present

## 2022-03-20 DIAGNOSIS — Z79899 Other long term (current) drug therapy: Secondary | ICD-10-CM | POA: Diagnosis not present

## 2022-03-20 DIAGNOSIS — R040 Epistaxis: Secondary | ICD-10-CM

## 2022-03-20 MED ORDER — OXYMETAZOLINE HCL 0.05 % NA SOLN
2.0000 | Freq: Once | NASAL | Status: AC
  Administered 2022-03-20: 2 via NASAL
  Filled 2022-03-20: qty 30

## 2022-03-20 MED ORDER — SILVER NITRATE-POT NITRATE 75-25 % EX MISC
2.0000 | Freq: Once | CUTANEOUS | Status: AC
  Administered 2022-03-20: 2 via TOPICAL
  Filled 2022-03-20: qty 10

## 2022-03-20 NOTE — Discharge Instructions (Signed)
1.  Hold aspirin x5 days, then resume. ?2.  If nose bleeds again, apply 2 sprays of Afrin to left nostril and apply nasal clamp.  Tilt your head down, apply ice to the bridge of your nose.  Proceed to the ER if nose is still bleeding after 20 minutes. ?3.  Return to the ER for recurrent or worsening symptoms, persistent vomiting, difficulty breathing or other concerns. ?
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
## 2022-03-20 NOTE — ED Triage Notes (Signed)
Pt to ED from home via EMS c/o epistaxis that started yesterday morning intermittently.  States noticed bleeding from left eye as well.  Takes baby ASA daily, denies pain or injury.  Pt A&Ox4, chest rise even and unlabored, in NAD at this time.  Nose clamp in place from EMS, bleeding controlled with clamp in place. ?

## 2022-04-07 ENCOUNTER — Other Ambulatory Visit: Payer: Self-pay | Admitting: Family Medicine

## 2022-04-07 DIAGNOSIS — Z1231 Encounter for screening mammogram for malignant neoplasm of breast: Secondary | ICD-10-CM

## 2022-11-30 IMAGING — CR DG CHEST 2V
2 series · 3 of 3 positions shown · non-contrast
Comparison: 12/21/2020

CLINICAL DATA: Suspected sepsis.  Open wound to right breast.

EXAM:
CHEST - 2 VIEW

[Series 2: chest lat · 0.14mm/px · 2 of 2 slices shown]
[im 1/2]
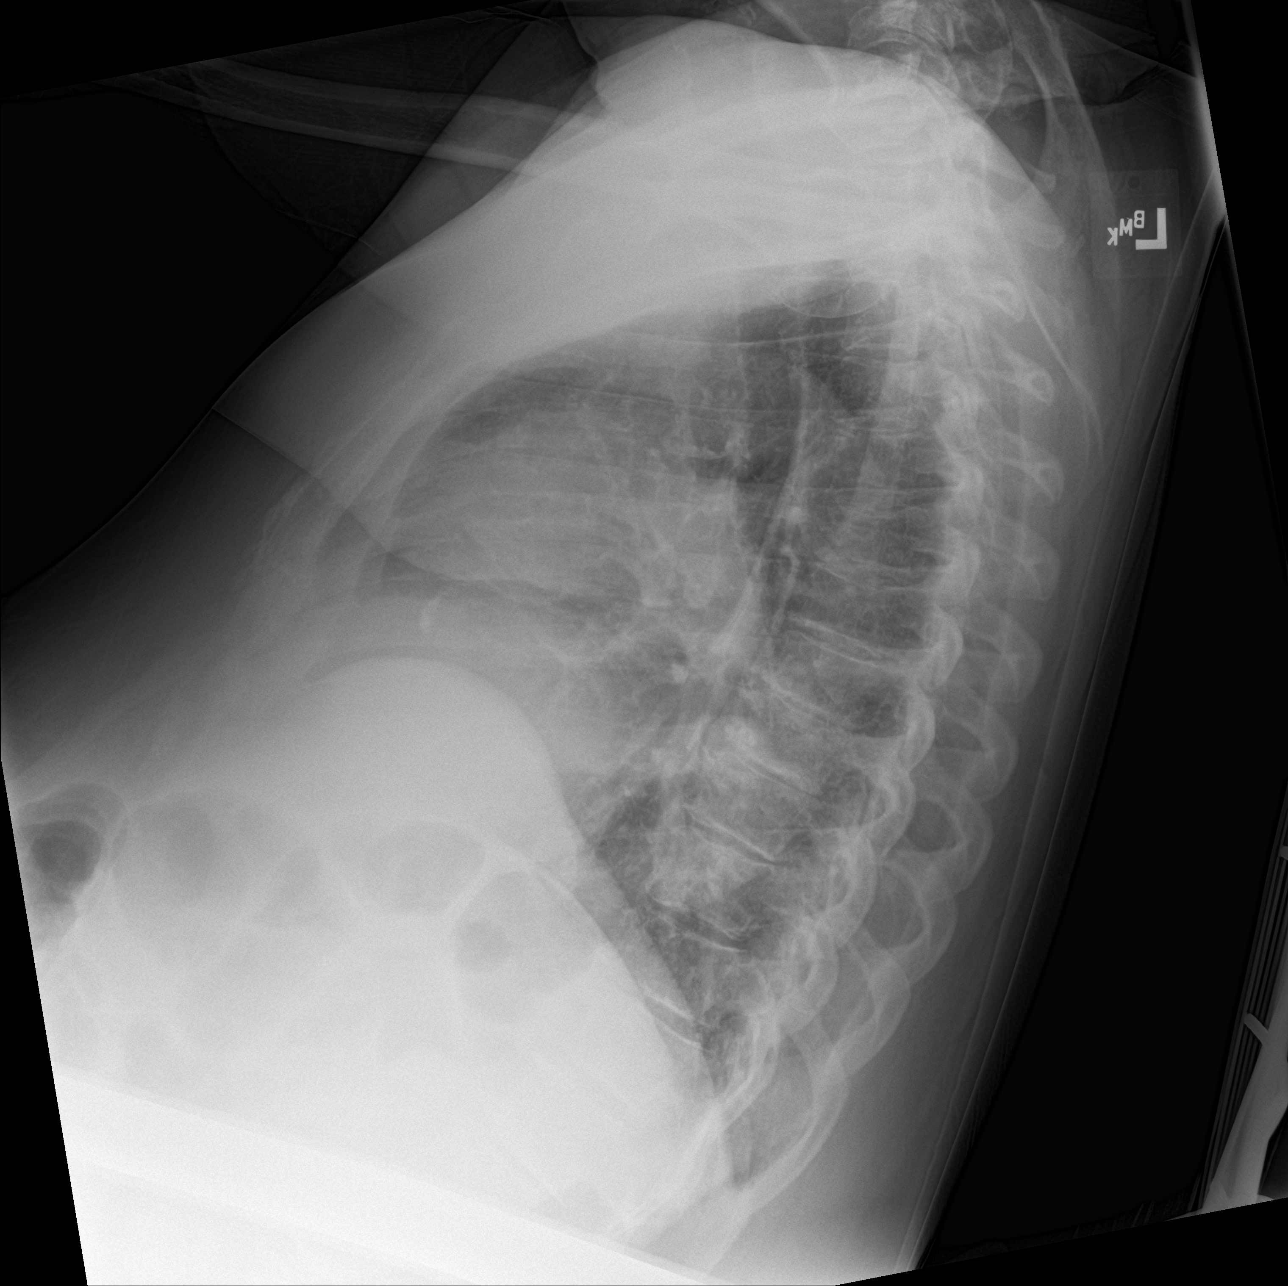
[im 2/2]
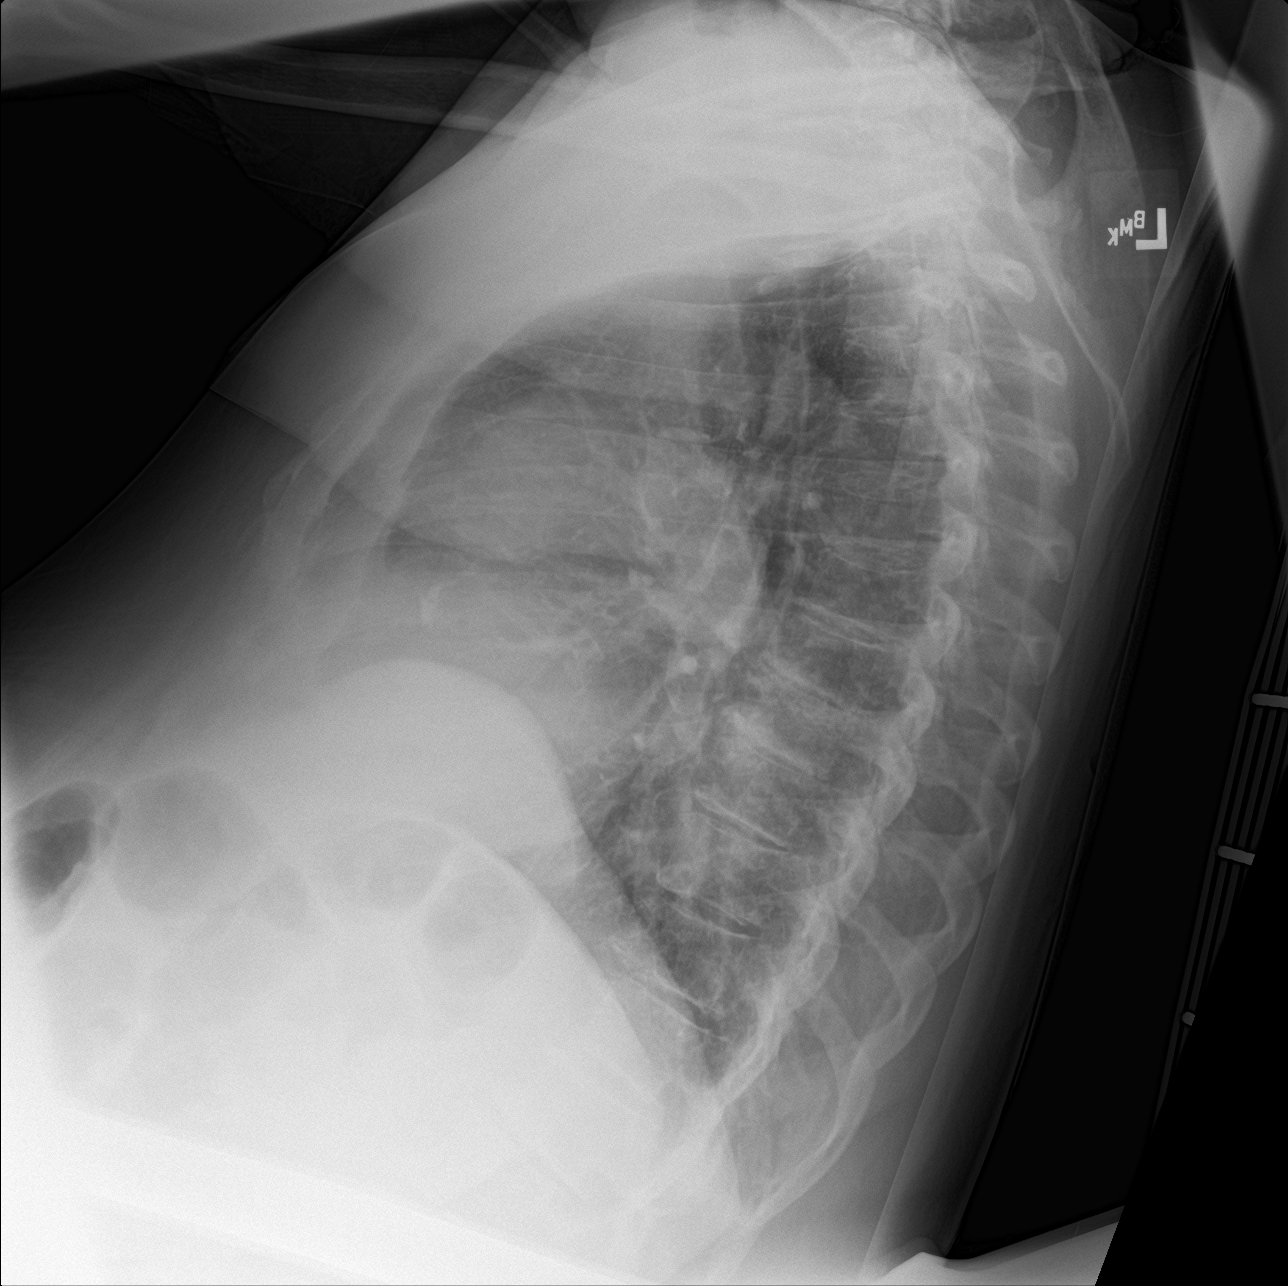

[chest ap]
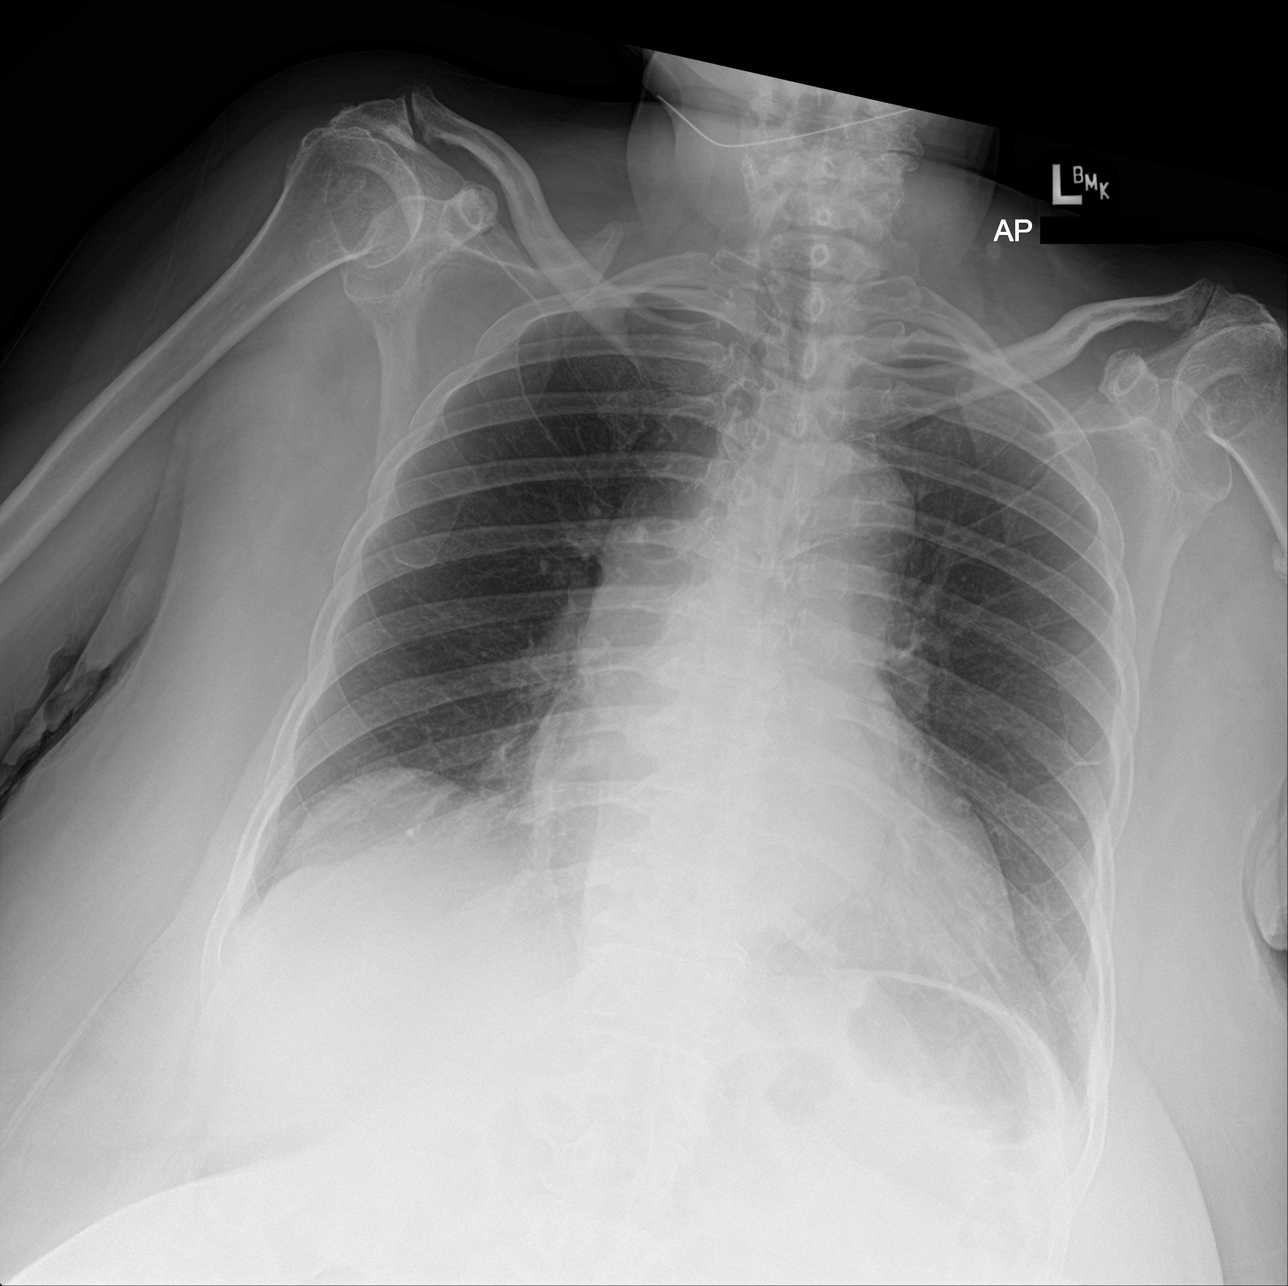

[3 of 3 positions shown; findings below may reference images not displayed]

FINDINGS: The cardiomediastinal contours are normal. The lungs are clear.
Pulmonary vasculature is normal. No consolidation, pleural effusion,
or pneumothorax. Thoracic spondylosis. No acute osseous
abnormalities are seen. No obvious chest wall soft tissue gas.
IMPRESSION: No acute chest findings.

## 2022-12-01 ENCOUNTER — Other Ambulatory Visit: Payer: Self-pay

## 2022-12-01 DIAGNOSIS — I10 Essential (primary) hypertension: Secondary | ICD-10-CM | POA: Diagnosis present

## 2022-12-01 DIAGNOSIS — E860 Dehydration: Secondary | ICD-10-CM | POA: Diagnosis present

## 2022-12-01 DIAGNOSIS — D6489 Other specified anemias: Secondary | ICD-10-CM | POA: Diagnosis present

## 2022-12-01 DIAGNOSIS — N61 Mastitis without abscess: Secondary | ICD-10-CM | POA: Diagnosis present

## 2022-12-01 DIAGNOSIS — Z751 Person awaiting admission to adequate facility elsewhere: Secondary | ICD-10-CM

## 2022-12-01 DIAGNOSIS — Z7982 Long term (current) use of aspirin: Secondary | ICD-10-CM

## 2022-12-01 DIAGNOSIS — R627 Adult failure to thrive: Secondary | ICD-10-CM | POA: Diagnosis present

## 2022-12-01 DIAGNOSIS — L304 Erythema intertrigo: Secondary | ICD-10-CM | POA: Diagnosis present

## 2022-12-01 DIAGNOSIS — Z86010 Personal history of colonic polyps: Secondary | ICD-10-CM

## 2022-12-01 DIAGNOSIS — Z1152 Encounter for screening for COVID-19: Secondary | ICD-10-CM

## 2022-12-01 DIAGNOSIS — R55 Syncope and collapse: Secondary | ICD-10-CM | POA: Diagnosis not present

## 2022-12-01 DIAGNOSIS — E538 Deficiency of other specified B group vitamins: Secondary | ICD-10-CM | POA: Diagnosis present

## 2022-12-01 DIAGNOSIS — R7989 Other specified abnormal findings of blood chemistry: Secondary | ICD-10-CM | POA: Diagnosis not present

## 2022-12-01 DIAGNOSIS — Z8249 Family history of ischemic heart disease and other diseases of the circulatory system: Secondary | ICD-10-CM

## 2022-12-01 DIAGNOSIS — E0781 Sick-euthyroid syndrome: Secondary | ICD-10-CM | POA: Diagnosis present

## 2022-12-01 DIAGNOSIS — I959 Hypotension, unspecified: Secondary | ICD-10-CM | POA: Diagnosis present

## 2022-12-01 DIAGNOSIS — E872 Acidosis, unspecified: Secondary | ICD-10-CM | POA: Diagnosis present

## 2022-12-01 DIAGNOSIS — R600 Localized edema: Secondary | ICD-10-CM | POA: Diagnosis not present

## 2022-12-01 DIAGNOSIS — E785 Hyperlipidemia, unspecified: Secondary | ICD-10-CM | POA: Diagnosis present

## 2022-12-01 DIAGNOSIS — Z79899 Other long term (current) drug therapy: Secondary | ICD-10-CM

## 2022-12-01 DIAGNOSIS — F1020 Alcohol dependence, uncomplicated: Secondary | ICD-10-CM | POA: Diagnosis present

## 2022-12-01 DIAGNOSIS — G9341 Metabolic encephalopathy: Secondary | ICD-10-CM | POA: Diagnosis present

## 2022-12-01 DIAGNOSIS — Z6834 Body mass index (BMI) 34.0-34.9, adult: Secondary | ICD-10-CM

## 2022-12-01 DIAGNOSIS — E162 Hypoglycemia, unspecified: Secondary | ICD-10-CM | POA: Diagnosis present

## 2022-12-01 DIAGNOSIS — R5381 Other malaise: Secondary | ICD-10-CM | POA: Diagnosis present

## 2022-12-01 DIAGNOSIS — Z882 Allergy status to sulfonamides status: Secondary | ICD-10-CM

## 2022-12-01 LAB — BASIC METABOLIC PANEL
Anion gap: 22 — ABNORMAL HIGH (ref 5–15)
BUN: 18 mg/dL (ref 8–23)
CO2: 17 mmol/L — ABNORMAL LOW (ref 22–32)
Calcium: 9.3 mg/dL (ref 8.9–10.3)
Chloride: 95 mmol/L — ABNORMAL LOW (ref 98–111)
Creatinine, Ser: 1.1 mg/dL — ABNORMAL HIGH (ref 0.44–1.00)
GFR, Estimated: 52 mL/min — ABNORMAL LOW (ref >60)
Glucose, Bld: 98 mg/dL (ref 70–99)
Potassium: 3.9 mmol/L (ref 3.5–5.1)
Sodium: 134 mmol/L — ABNORMAL LOW (ref 135–145)

## 2022-12-01 LAB — CBC
HCT: 29.4 % — ABNORMAL LOW (ref 36.0–46.0)
Hemoglobin: 9 g/dL — ABNORMAL LOW (ref 12.0–15.0)
MCH: 27.4 pg (ref 26.0–34.0)
MCHC: 30.6 g/dL (ref 30.0–36.0)
MCV: 89.4 fL (ref 80.0–100.0)
Platelets: 169 10*3/uL (ref 150–400)
RBC: 3.29 MIL/uL — ABNORMAL LOW (ref 3.87–5.11)
RDW: 19.1 % — ABNORMAL HIGH (ref 11.5–15.5)
WBC: 6.6 10*3/uL (ref 4.0–10.5)
nRBC: 0.5 % — ABNORMAL HIGH (ref 0.0–0.2)

## 2022-12-01 LAB — TROPONIN I (HIGH SENSITIVITY)
Troponin I (High Sensitivity): 4 ng/L (ref <18)
Troponin I (High Sensitivity): 7 ng/L (ref <18)

## 2022-12-01 MED ORDER — SODIUM CHLORIDE 0.9 % IV BOLUS
1000.0000 mL | Freq: Once | INTRAVENOUS | Status: AC
  Administered 2022-12-01: 1000 mL via INTRAVENOUS

## 2022-12-01 NOTE — ED Triage Notes (Addendum)
Pt states she was sitting down when she had loss of consciousness. Pt reports as per family it was two times. Pt denies pain. Pt states this has never happened before.  Pt states she has tremors at baseline

## 2022-12-01 NOTE — ED Provider Triage Note (Signed)
Emergency Medicine Provider Triage Evaluation Note  Sheri Butler , a 76 y.o. female  was evaluated in triage.  Pt complains of 2 syncopal episodes today while sitting in her chair--per EMS report. Patient denies pain and does not remember reported events.  Physical Exam  There were no vitals taken for this visit. Gen:   Awake, no distress   Resp:  Normal effort  MSK:   Moves extremities without difficulty  Other:    Medical Decision Making  Medically screening exam initiated at 2:35 PM.  Appropriate orders placed.  DARBI CHANDRAN was informed that the remainder of the evaluation will be completed by another provider, this initial triage assessment does not replace that evaluation, and the importance of remaining in the ED until their evaluation is complete.    Victorino Dike, FNP 12/01/22 1436

## 2022-12-01 NOTE — ED Notes (Signed)
FIRST NURSE NOTE:  Pt to ED from home for 2 syncopal episodes today, via [facility], first ems was called and pt refused to come, then had second syncopal episode within 1 hour of first and called EMS again.  Pt has baseline tremors to both arms, L arm always weaker than R. Recent URI and possiblew fungal infx under breasts. EMS VS: 114/66, A&O X4, 98% on RA, HR 88, CBG 128, 12 lead was NSR, temp 98.0, ET CO@ 29, EMS 1 unsuccessful IV attempt.

## 2022-12-02 ENCOUNTER — Emergency Department: Payer: Medicare Other

## 2022-12-02 ENCOUNTER — Inpatient Hospital Stay
Admission: EM | Admit: 2022-12-02 | Discharge: 2022-12-08 | DRG: 312 | Disposition: A | Discharge status: Skilled Nursing Facility | Payer: Medicare Other | Attending: Internal Medicine | Admitting: Internal Medicine

## 2022-12-02 ENCOUNTER — Observation Stay: Payer: Medicare Other

## 2022-12-02 DIAGNOSIS — E861 Hypovolemia: Secondary | ICD-10-CM | POA: Diagnosis not present

## 2022-12-02 DIAGNOSIS — I9589 Other hypotension: Secondary | ICD-10-CM | POA: Diagnosis not present

## 2022-12-02 DIAGNOSIS — R4182 Altered mental status, unspecified: Secondary | ICD-10-CM | POA: Diagnosis not present

## 2022-12-02 DIAGNOSIS — Z751 Person awaiting admission to adequate facility elsewhere: Secondary | ICD-10-CM | POA: Diagnosis not present

## 2022-12-02 DIAGNOSIS — Z7982 Long term (current) use of aspirin: Secondary | ICD-10-CM | POA: Diagnosis not present

## 2022-12-02 DIAGNOSIS — F1020 Alcohol dependence, uncomplicated: Secondary | ICD-10-CM | POA: Insufficient documentation

## 2022-12-02 DIAGNOSIS — E162 Hypoglycemia, unspecified: Secondary | ICD-10-CM | POA: Diagnosis present

## 2022-12-02 DIAGNOSIS — N61 Mastitis without abscess: Secondary | ICD-10-CM | POA: Diagnosis present

## 2022-12-02 DIAGNOSIS — Z1152 Encounter for screening for COVID-19: Secondary | ICD-10-CM | POA: Diagnosis not present

## 2022-12-02 DIAGNOSIS — R627 Adult failure to thrive: Secondary | ICD-10-CM | POA: Diagnosis present

## 2022-12-02 DIAGNOSIS — Z79899 Other long term (current) drug therapy: Secondary | ICD-10-CM | POA: Diagnosis not present

## 2022-12-02 DIAGNOSIS — G9341 Metabolic encephalopathy: Secondary | ICD-10-CM | POA: Diagnosis present

## 2022-12-02 DIAGNOSIS — Z8249 Family history of ischemic heart disease and other diseases of the circulatory system: Secondary | ICD-10-CM | POA: Diagnosis not present

## 2022-12-02 DIAGNOSIS — E860 Dehydration: Secondary | ICD-10-CM | POA: Diagnosis present

## 2022-12-02 DIAGNOSIS — R55 Syncope and collapse: Secondary | ICD-10-CM

## 2022-12-02 DIAGNOSIS — I959 Hypotension, unspecified: Secondary | ICD-10-CM

## 2022-12-02 DIAGNOSIS — D6489 Other specified anemias: Secondary | ICD-10-CM | POA: Diagnosis present

## 2022-12-02 DIAGNOSIS — R5381 Other malaise: Secondary | ICD-10-CM | POA: Diagnosis present

## 2022-12-02 DIAGNOSIS — R600 Localized edema: Secondary | ICD-10-CM | POA: Diagnosis not present

## 2022-12-02 DIAGNOSIS — Z6834 Body mass index (BMI) 34.0-34.9, adult: Secondary | ICD-10-CM | POA: Diagnosis not present

## 2022-12-02 DIAGNOSIS — E785 Hyperlipidemia, unspecified: Secondary | ICD-10-CM | POA: Diagnosis present

## 2022-12-02 DIAGNOSIS — I10 Essential (primary) hypertension: Secondary | ICD-10-CM | POA: Diagnosis present

## 2022-12-02 DIAGNOSIS — E872 Acidosis, unspecified: Secondary | ICD-10-CM

## 2022-12-02 DIAGNOSIS — E538 Deficiency of other specified B group vitamins: Secondary | ICD-10-CM | POA: Diagnosis present

## 2022-12-02 DIAGNOSIS — Z882 Allergy status to sulfonamides status: Secondary | ICD-10-CM | POA: Diagnosis not present

## 2022-12-02 DIAGNOSIS — E0781 Sick-euthyroid syndrome: Secondary | ICD-10-CM | POA: Diagnosis present

## 2022-12-02 DIAGNOSIS — L304 Erythema intertrigo: Secondary | ICD-10-CM | POA: Diagnosis present

## 2022-12-02 LAB — HEPATIC FUNCTION PANEL
ALT: 9 U/L (ref 0–44)
AST: 27 U/L (ref 15–41)
Albumin: 2.7 g/dL — ABNORMAL LOW (ref 3.5–5.0)
Alkaline Phosphatase: 38 U/L (ref 38–126)
Bilirubin, Direct: 0.4 mg/dL — ABNORMAL HIGH (ref 0.0–0.2)
Indirect Bilirubin: 1.3 mg/dL — ABNORMAL HIGH (ref 0.3–0.9)
Total Bilirubin: 1.7 mg/dL — ABNORMAL HIGH (ref 0.3–1.2)
Total Protein: 6.6 g/dL (ref 6.5–8.1)

## 2022-12-02 LAB — URINALYSIS, ROUTINE W REFLEX MICROSCOPIC
Bilirubin Urine: NEGATIVE
Glucose, UA: NEGATIVE mg/dL
Hgb urine dipstick: NEGATIVE
Ketones, ur: 20 mg/dL — AB
Nitrite: NEGATIVE
Protein, ur: 30 mg/dL — AB
Specific Gravity, Urine: 1.023 (ref 1.005–1.030)
pH: 5 (ref 5.0–8.0)

## 2022-12-02 LAB — IRON AND TIBC
Iron: 93 ug/dL (ref 28–170)
Saturation Ratios: 42 % — ABNORMAL HIGH (ref 10.4–31.8)
TIBC: 223 ug/dL — ABNORMAL LOW (ref 250–450)
UIBC: 130 ug/dL

## 2022-12-02 LAB — FOLATE: Folate: 3.7 ng/mL — ABNORMAL LOW (ref >5.9)

## 2022-12-02 LAB — BASIC METABOLIC PANEL
Anion gap: 12 (ref 5–15)
BUN: 18 mg/dL (ref 8–23)
CO2: 22 mmol/L (ref 22–32)
Calcium: 7.9 mg/dL — ABNORMAL LOW (ref 8.9–10.3)
Chloride: 105 mmol/L (ref 98–111)
Creatinine, Ser: 1.07 mg/dL — ABNORMAL HIGH (ref 0.44–1.00)
GFR, Estimated: 53 mL/min — ABNORMAL LOW (ref >60)
Glucose, Bld: 151 mg/dL — ABNORMAL HIGH (ref 70–99)
Potassium: 3.5 mmol/L (ref 3.5–5.1)
Sodium: 139 mmol/L (ref 135–145)

## 2022-12-02 LAB — CBC
HCT: 22.8 % — ABNORMAL LOW (ref 36.0–46.0)
Hemoglobin: 7.1 g/dL — ABNORMAL LOW (ref 12.0–15.0)
MCH: 28 pg (ref 26.0–34.0)
MCHC: 31.1 g/dL (ref 30.0–36.0)
MCV: 89.8 fL (ref 80.0–100.0)
Platelets: 148 10*3/uL — ABNORMAL LOW (ref 150–400)
RBC: 2.54 MIL/uL — ABNORMAL LOW (ref 3.87–5.11)
RDW: 19.2 % — ABNORMAL HIGH (ref 11.5–15.5)
WBC: 5.5 10*3/uL (ref 4.0–10.5)
nRBC: 0 % (ref 0.0–0.2)

## 2022-12-02 LAB — RETICULOCYTES
Immature Retic Fract: 19 % — ABNORMAL HIGH (ref 2.3–15.9)
RBC.: 2.5 MIL/uL — ABNORMAL LOW (ref 3.87–5.11)
Retic Count, Absolute: 24.8 10*3/uL (ref 19.0–186.0)
Retic Ct Pct: 1 % (ref 0.4–3.1)

## 2022-12-02 LAB — LACTIC ACID, PLASMA
Lactic Acid, Venous: 1.2 mmol/L (ref 0.5–1.9)
Lactic Acid, Venous: 1.2 mmol/L (ref 0.5–1.9)

## 2022-12-02 LAB — RESP PANEL BY RT-PCR (RSV, FLU A&B, COVID)  RVPGX2
Influenza A by PCR: NEGATIVE
Influenza B by PCR: NEGATIVE
Resp Syncytial Virus by PCR: NEGATIVE
SARS Coronavirus 2 by RT PCR: NEGATIVE

## 2022-12-02 LAB — TROPONIN I (HIGH SENSITIVITY): Troponin I (High Sensitivity): 6 ng/L (ref <18)

## 2022-12-02 LAB — HEMOGLOBIN AND HEMATOCRIT, BLOOD
HCT: 22.5 % — ABNORMAL LOW (ref 36.0–46.0)
Hemoglobin: 7 g/dL — ABNORMAL LOW (ref 12.0–15.0)

## 2022-12-02 LAB — PROCALCITONIN: Procalcitonin: 0.22 ng/mL

## 2022-12-02 LAB — FERRITIN: Ferritin: 168 ng/mL (ref 11–307)

## 2022-12-02 LAB — ETHANOL: Alcohol, Ethyl (B): <10 mg/dL (ref <10)

## 2022-12-02 LAB — TSH: TSH: 4.54 u[IU]/mL — ABNORMAL HIGH (ref 0.350–4.500)

## 2022-12-02 LAB — VITAMIN B12: Vitamin B-12: 783 pg/mL (ref 180–914)

## 2022-12-02 MED ORDER — NYSTATIN 100000 UNIT/GM EX CREA
TOPICAL_CREAM | Freq: Two times a day (BID) | CUTANEOUS | Status: DC
  Filled 2022-12-02: qty 30

## 2022-12-02 MED ORDER — SODIUM CHLORIDE 0.9% IV SOLUTION
Freq: Once | INTRAVENOUS | Status: DC
  Filled 2022-12-02: qty 250

## 2022-12-02 MED ORDER — LORAZEPAM 1 MG PO TABS
1.0000 mg | ORAL_TABLET | ORAL | Status: AC | PRN
  Administered 2022-12-03: 2 mg via ORAL
  Filled 2022-12-02: qty 2
  Filled 2022-12-02: qty 3

## 2022-12-02 MED ORDER — ADULT MULTIVITAMIN W/MINERALS CH
1.0000 | ORAL_TABLET | Freq: Every day | ORAL | Status: DC
  Administered 2022-12-02 – 2022-12-08 (×6): 1 via ORAL
  Filled 2022-12-02 (×8): qty 1

## 2022-12-02 MED ORDER — MENTHOL 3 MG MT LOZG
1.0000 | LOZENGE | OROMUCOSAL | Status: DC | PRN
  Filled 2022-12-02: qty 9

## 2022-12-02 MED ORDER — ACETAMINOPHEN 325 MG PO TABS
650.0000 mg | ORAL_TABLET | Freq: Four times a day (QID) | ORAL | Status: DC | PRN
  Administered 2022-12-08: 650 mg via ORAL
  Filled 2022-12-02: qty 2

## 2022-12-02 MED ORDER — SODIUM CHLORIDE 0.9% FLUSH
3.0000 mL | Freq: Two times a day (BID) | INTRAVENOUS | Status: DC
  Administered 2022-12-02 – 2022-12-08 (×13): 3 mL via INTRAVENOUS

## 2022-12-02 MED ORDER — THIAMINE HCL 100 MG/ML IJ SOLN
100.0000 mg | Freq: Once | INTRAMUSCULAR | Status: AC
  Administered 2022-12-02: 100 mg via INTRAVENOUS
  Filled 2022-12-02: qty 2

## 2022-12-02 MED ORDER — LACTATED RINGERS IV BOLUS
500.0000 mL | Freq: Once | INTRAVENOUS | Status: AC
  Administered 2022-12-02: 500 mL via INTRAVENOUS

## 2022-12-02 MED ORDER — ENOXAPARIN SODIUM 40 MG/0.4ML IJ SOSY: 40.0000 mg | PREFILLED_SYRINGE | INTRAMUSCULAR | Status: DC

## 2022-12-02 MED ORDER — ONDANSETRON HCL 4 MG PO TABS: 4.0000 mg | ORAL_TABLET | Freq: Four times a day (QID) | ORAL | Status: DC | PRN

## 2022-12-02 MED ORDER — FOLIC ACID 1 MG PO TABS
1.0000 mg | ORAL_TABLET | Freq: Every day | ORAL | Status: DC
  Administered 2022-12-02 – 2022-12-08 (×6): 1 mg via ORAL
  Filled 2022-12-02 (×8): qty 1

## 2022-12-02 MED ORDER — SODIUM CHLORIDE 0.9 % IV BOLUS (SEPSIS)
1000.0000 mL | Freq: Once | INTRAVENOUS | Status: AC
  Administered 2022-12-02: 1000 mL via INTRAVENOUS

## 2022-12-02 MED ORDER — BENZONATATE 100 MG PO CAPS: 100.0000 mg | ORAL_CAPSULE | Freq: Three times a day (TID) | ORAL | Status: DC | PRN

## 2022-12-02 MED ORDER — SODIUM CHLORIDE 0.9 % IV BOLUS (SEPSIS): 1000.0000 mL | Freq: Once | INTRAVENOUS | Status: DC

## 2022-12-02 MED ORDER — LORAZEPAM 2 MG/ML IJ SOLN
1.0000 mg | INTRAMUSCULAR | Status: AC | PRN
  Administered 2022-12-03: 3 mg via INTRAVENOUS

## 2022-12-02 MED ORDER — LORAZEPAM 2 MG/ML IJ SOLN
0.0000 mg | Freq: Two times a day (BID) | INTRAMUSCULAR | Status: AC
  Administered 2022-12-05: 1 mg via INTRAVENOUS
  Filled 2022-12-02: qty 1

## 2022-12-02 MED ORDER — THIAMINE HCL 100 MG/ML IJ SOLN
100.0000 mg | Freq: Every day | INTRAMUSCULAR | Status: DC
  Administered 2022-12-03 – 2022-12-08 (×3): 100 mg via INTRAVENOUS
  Filled 2022-12-02 (×4): qty 2

## 2022-12-02 MED ORDER — ONDANSETRON HCL 4 MG/2ML IJ SOLN: 4.0000 mg | Freq: Four times a day (QID) | INTRAMUSCULAR | Status: DC | PRN

## 2022-12-02 MED ORDER — DEXTROSE-NACL 5-0.9 % IV SOLN: INTRAVENOUS | Status: DC

## 2022-12-02 MED ORDER — SODIUM CHLORIDE 0.9 % IV SOLN
1.0000 g | INTRAVENOUS | Status: DC
  Administered 2022-12-02 – 2022-12-08 (×6): 1 g via INTRAVENOUS
  Filled 2022-12-02 (×2): qty 10
  Filled 2022-12-02: qty 1
  Filled 2022-12-02 (×4): qty 10
  Filled 2022-12-02: qty 1

## 2022-12-02 MED ORDER — LORAZEPAM 2 MG/ML IJ SOLN
0.0000 mg | Freq: Four times a day (QID) | INTRAMUSCULAR | Status: AC
  Administered 2022-12-03: 2 mg via INTRAVENOUS
  Filled 2022-12-02: qty 2
  Filled 2022-12-02: qty 1

## 2022-12-02 MED ORDER — LACTATED RINGERS IV BOLUS
1000.0000 mL | Freq: Once | INTRAVENOUS | Status: AC
  Administered 2022-12-02: 1000 mL via INTRAVENOUS

## 2022-12-02 MED ORDER — THIAMINE MONONITRATE 100 MG PO TABS
100.0000 mg | ORAL_TABLET | Freq: Every day | ORAL | Status: DC
  Administered 2022-12-04 – 2022-12-07 (×3): 100 mg via ORAL
  Filled 2022-12-02 (×7): qty 1

## 2022-12-02 MED ORDER — ACETAMINOPHEN 650 MG RE SUPP: 650.0000 mg | Freq: Four times a day (QID) | RECTAL | Status: DC | PRN

## 2022-12-02 NOTE — Assessment & Plan Note (Addendum)
Secondary to submammary intertrigo Rocephin Topical nystatin cream to affected area Monitor for sepsis criteria Continue IV hydration in anticipation of possible sepsis

## 2022-12-02 NOTE — Assessment & Plan Note (Addendum)
Hypotension Suspect secondary to dehydration, etiology uncertain but suspecting poor oral intake related to heavy drinking Follow-up CT head Continue IV hydration Continuous cardiac monitoring [facility]ostatics in the a.m.

## 2022-12-02 NOTE — Progress Notes (Addendum)
  SAME DAY PROGRESS NOTE   Sheri Butler  TJQ:[id number] DOB: January 02, 1946 DOA: 12/02/2022 PCP: Sheri Lank, MD   Date of Service: the patient was seen and examined on 12/02/2022  Agree with Erin Hearing NP's note with the following problem specific changes below.    Assessment and Plan: * Syncope Hypotension  Admitting provider suspicion patient suffered from 2 episodes of syncope.  Will obtain echocardiogram.  No evidence of arrhythmia on telemetry since hospitalized.  In the setting of chronic alcohol abuse will obtain EEG to rule out any seizure activity.  Anemia  Progressively worsening anemia.  No clinical evidence of melena or bright red blood per rectum.  Suspect hemodilution superimposed on myelosuppression from chronic heavy alcohol abuse.  1 unit PRBC transfusion due to 2 episodes of syncope.  To work this anemia up further, obtaining iron panel, B12, folate hepatic function panel.  Obtaining stool Hemoccult.  If stool Hemoccult positive or if hemoglobin continues to downtrend will obtain gastroenterology consultation.  Cellulitis of right breast  Continuing Rocephin with topical nystatin per admitting provider.  Alcohol use disorder, moderate, dependence ([place])  CIWA withdrawal protocol per admitting provider.    Physical Exam:  Vitals:   12/02/22 1025 12/02/22 1116 12/02/22 1139 12/02/22 1200  BP: (!) 88/60 (!) 86/55 (!) 97/56 108/67  Pulse: 80 87  80  Resp:  16  (!) 23  Temp:      TempSrc:      SpO2:  100%  94%    Constitutional: Lethargic but arousable, oriented x 2, no associated distress.   Skin: no rashes, no lesions, poor skin turgor noted. Abdomen: Abdomen is soft and nontender.  No evidence of intra-abdominal masses.  Positive bowel sounds noted in all quadrants.     Author:  Vernelle Emerald MD  12/02/2022 2:28 PM     Same-day note: This patient is a 76 year old female patient admitted after 2 syncopal episodes on 12/25.  Patient  presented with suboptimal blood pressure readings in the ER with systolic readings between 88 and 90 and was given 2 L of IV fluids.  No other significant findings noted on initial workup.  After volume replacement patient was found to have hemoglobin decreased from a baseline of 8-10 to 7.0.  Patient was found to have a mildly elevated total bilirubin at presentation of 1.7.  Platelets have decreased somewhat 248,000.  Because of the hemoglobin drifting down more and anemia workup has been initiated.  Normal at 93 with low TIBC and elevated saturation ratio.  Folate was 3.7, B12 pending at time of dictation.  Patient had findings of evolving sick euthyroid syndrome with slightly elevated TSH of 4.54.  Because she continues to have issues with suboptimal blood pressure reading and globin in the past 24 hours has decreased from 9-7 the decision has been made to give the patient 2 units of PRBCs with suspicion of underlying myelosuppression from her cirrhosis.  We will also check fecal occult blood.  Patient was also found to have abnormal urinalysis concerning for UTI.  Urine culture pending.  No indication at this time to consult GI but obviously if fecal occult blood is positive or patient develops symptoms of GI bleeding (which she currently does not have) then GI would need to be consulted

## 2022-12-02 NOTE — ED Notes (Signed)
Patient with no complaints at this time. Temperature recheck is 99.53F, vital signs stable. MD Shalhoub notified and will continue with blood transfusion at this time.

## 2022-12-02 NOTE — Assessment & Plan Note (Signed)
CIWA withdrawal protocol 

## 2022-12-02 NOTE — ED Notes (Signed)
Patient temperature went from initial 98.6 to 100.81fon 15 min. VS recheck after blood started. Otherwise vital signs stable and patient with no complaints. MD Shalhoub notified and he states to recheck VS within 15-30 min and continue to administer blood.

## 2022-12-02 NOTE — Assessment & Plan Note (Signed)
Hold home antihypertensives due to hypotension

## 2022-12-02 NOTE — Assessment & Plan Note (Signed)
Bicarb of 17, suspecting dehydration IV hydration and monitor

## 2022-12-02 NOTE — ED Provider Notes (Signed)
[facility] Provider Note    None    (approximate)   History   Loss of Consciousness (x2)   HPI  Sheri Butler is a 76 y.o. female with history of hypertension, hyperlipidemia who presents to the emergency department with her son for concerns for 2 syncopal events today.  Patient hypotensive here.  Denies any injury from her syncopal events or seizure-like activity.  No chest pain, shortness of breath, abdominal pain, fevers, vomiting, diarrhea, bloody stools or melena.  Son reports that she only drinks scotch all day and thinks she is dehydrated.  He is on lisinopril for hypertension.  No changes in medication per son.  Patient lives at home with family members but son at bedside does not live with her.   History provided by patient and son.    Past Medical History:  Diagnosis Date   Hyperlipidemia    Hypertension     Past Surgical History:  Procedure Laterality Date   COLONOSCOPY WITH PROPOFOL N/A 10/22/2017   Procedure: COLONOSCOPY WITH PROPOFOL;  Surgeon: Jonathon Bellows, MD;  Location: [facility] ENDOSCOPY;  Service: Gastroenterology;  Laterality: N/A;   ESOPHAGOGASTRODUODENOSCOPY (EGD) WITH PROPOFOL N/A 10/22/2017   Procedure: ESOPHAGOGASTRODUODENOSCOPY (EGD) WITH PROPOFOL;  Surgeon: Jonathon Bellows, MD;  Location: [facility] ENDOSCOPY;  Service: Gastroenterology;  Laterality: N/A;   none      MEDICATIONS:  Prior to Admission medications   Medication Sig Start Date End Date Taking? Authorizing Provider  aspirin EC 81 MG tablet Take 81 mg by mouth daily.  04/30/09   [provider]  atenolol (TENORMIN) 25 MG tablet Take 25 mg by mouth daily.  10/19/15   [provider]  cholecalciferol (VITAMIN D) 25 MCG tablet Take 1 tablet (1,000 Units total) by mouth daily. 12/24/20   Fritzi Mandes, MD  folic acid (FOLVITE) 1 MG tablet Take 1 mg by mouth daily. 12/17/20   [provider]  guaiFENesin-dextromethorphan (ROBITUSSIN DM) 100-10 MG/5ML syrup  Take 10 mLs by mouth every 4 (four) hours as needed for cough. 12/24/20   Fritzi Mandes, MD  pravastatin (PRAVACHOL) 20 MG tablet Take 1 tablet (20 mg total) by mouth daily at 6 PM. 12/24/20   Fritzi Mandes, MD    Physical Exam   Triage Vital Signs: ED Triage Vitals  Enc Vitals Group     BP 12/01/22 1440 90/60     Pulse Rate 12/01/22 1440 69     Resp 12/01/22 1440 18     Temp 12/01/22 1440 97.6 F (36.4 C)     Temp Source 12/01/22 1440 Tympanic     SpO2 12/01/22 1440 94 %     Weight --      Height --      Head Circumference --      Peak Flow --      Pain Score 12/01/22 1438 0     Pain Loc --      Pain Edu? --      Excl. in [place]? --     Most recent vital signs: Vitals:   12/02/22 0020 12/02/22 0130  BP: (!) 88/51 (!) 122/53  Pulse: 80 79  Resp: 18 20  Temp:  98.4 F (36.9 C)  SpO2: 97% 100%    CONSTITUTIONAL: Alert, elderly, rectal temperature 97.9 HEAD: Normocephalic, atraumatic EYES: Conjunctivae clear, pupils appear equal, sclera nonicteric ENT: normal nose; moist mucous membranes NECK: Supple, normal ROM, no midline spinal tenderness CARD: RRR; S1 and S2 appreciated; no murmurs, no clicks,  no rubs, no gallops RESP: Normal chest excursion without splinting or tachypnea; breath sounds clear and equal bilaterally; no wheezes, no rhonchi, no rales, no hypoxia or respiratory distress, speaking full sentences ABD/GI: Normal bowel sounds; non-distended; soft, non-tender, no rebound, no guarding, no peritoneal signs, no gross blood or melena on rectal exam, stool is brown in appearance BACK: The back appears normal EXT: Normal ROM in all joints; no deformity noted, no edema; no cyanosis NEURO: Moves all extremities equally, normal speech, no facial asymmetry PSYCH: The patient's mood and manner are appropriate.   ED Results / Procedures / Treatments   LABS: (all labs ordered are listed, but only abnormal results are displayed) Labs Reviewed  BASIC METABOLIC PANEL -  Abnormal; Notable for the following components:      Result Value   Sodium 134 (*)    Chloride 95 (*)    CO2 17 (*)    Creatinine, Ser 1.10 (*)    GFR, Estimated 52 (*)    Anion gap 22 (*)    All other components within normal limits  CBC - Abnormal; Notable for the following components:   RBC 3.29 (*)    Hemoglobin 9.0 (*)    HCT 29.4 (*)    RDW 19.1 (*)    nRBC 0.5 (*)    All other components within normal limits  HEPATIC FUNCTION PANEL - Abnormal; Notable for the following components:   Albumin 2.7 (*)    Total Bilirubin 1.7 (*)    Bilirubin, Direct 0.4 (*)    Indirect Bilirubin 1.3 (*)    All other components within normal limits  RESP PANEL BY RT-PCR (RSV, FLU A&B, COVID)  RVPGX2  PROCALCITONIN  ETHANOL  URINALYSIS, ROUTINE W REFLEX MICROSCOPIC  LACTIC ACID, PLASMA  LACTIC ACID, PLASMA  CBC  CREATININE, SERUM  BASIC METABOLIC PANEL  TROPONIN I (HIGH SENSITIVITY)  TROPONIN I (HIGH SENSITIVITY)  TROPONIN I (HIGH SENSITIVITY)     EKG:  EKG Interpretation  Date/Time:  Monday December 01 2022 14:35:27 EST Ventricular Rate:  108 PR Interval:  150 QRS Duration: 64 QT Interval:  324 QTC Calculation: 434 R Axis:   3 Text Interpretation: Sinus tachycardia Low voltage QRS Cannot rule out Anterior infarct (cited on or before 21-Dec-2020) Abnormal ECG When compared with ECG of 21-Dec-2020 20:15, T wave inversion no longer evident in Lateral leads Confirmed by Pryor Curia [phone]) on 12/02/2022 1:03:26 AM         RADIOLOGY: My personal review and interpretation of imaging: Chest x-ray clear.  CT head and cervical spine show no traumatic injury.  I have personally reviewed all radiology reports.   CT HEAD WO CONTRAST (5MM)  Result Date: 12/02/2022 CLINICAL DATA:  Trauma. EXAM: CT HEAD WITHOUT CONTRAST CT CERVICAL SPINE WITHOUT CONTRAST TECHNIQUE: Multidetector CT imaging of the head and cervical spine was performed following the standard protocol without  intravenous contrast. Multiplanar CT image reconstructions of the cervical spine were also generated. RADIATION DOSE REDUCTION: This exam was performed according to the departmental dose-optimization program which includes automated exposure control, adjustment of the mA and/or kV according to patient size and/or use of iterative reconstruction technique. COMPARISON:  Brain MRI dated 11/20/2015. FINDINGS: Evaluation of this exam is limited due to motion artifact. CT HEAD FINDINGS Brain: Mild age-related atrophy and chronic microvascular ischemic changes. There is no acute intracranial hemorrhage. No mass effect or midline shift no extra-axial fluid collection. Vascular: No hyperdense vessel or unexpected calcification. Skull: Normal. Negative for fracture or  focal lesion. Sinuses/Orbits: The visualized paranasal sinuses and mastoid air cells are clear. Age indeterminate, likely chronic depressed fracture of the right lamina Propecia. Other: None CT CERVICAL SPINE FINDINGS Alignment: No acute subluxation. There is reversal of normal cervical lordosis which may be positional or due to muscle spasm. Skull base and vertebrae: No acute fracture. Soft tissues and spinal canal: No prevertebral fluid or swelling. No visible canal hematoma. Disc levels: No acute findings. Multilevel degenerative changes with facet arthropathy. Upper chest: Negative. Other: Left carotid bulb calcified plaques. IMPRESSION: 1. No acute intracranial pathology. Mild age-related atrophy and chronic microvascular ischemic changes. 2. No acute/traumatic cervical spine pathology. Electronically Signed   By: Anner Crete M.D.   On: 12/02/2022 02:51   CT Cervical Spine Wo Contrast  Result Date: 12/02/2022 CLINICAL DATA:  Trauma. EXAM: CT HEAD WITHOUT CONTRAST CT CERVICAL SPINE WITHOUT CONTRAST TECHNIQUE: Multidetector CT imaging of the head and cervical spine was performed following the standard protocol without intravenous contrast.  Multiplanar CT image reconstructions of the cervical spine were also generated. RADIATION DOSE REDUCTION: This exam was performed according to the departmental dose-optimization program which includes automated exposure control, adjustment of the mA and/or kV according to patient size and/or use of iterative reconstruction technique. COMPARISON:  Brain MRI dated 11/20/2015. FINDINGS: Evaluation of this exam is limited due to motion artifact. CT HEAD FINDINGS Brain: Mild age-related atrophy and chronic microvascular ischemic changes. There is no acute intracranial hemorrhage. No mass effect or midline shift no extra-axial fluid collection. Vascular: No hyperdense vessel or unexpected calcification. Skull: Normal. Negative for fracture or focal lesion. Sinuses/Orbits: The visualized paranasal sinuses and mastoid air cells are clear. Age indeterminate, likely chronic depressed fracture of the right lamina Propecia. Other: None CT CERVICAL SPINE FINDINGS Alignment: No acute subluxation. There is reversal of normal cervical lordosis which may be positional or due to muscle spasm. Skull base and vertebrae: No acute fracture. Soft tissues and spinal canal: No prevertebral fluid or swelling. No visible canal hematoma. Disc levels: No acute findings. Multilevel degenerative changes with facet arthropathy. Upper chest: Negative. Other: Left carotid bulb calcified plaques. IMPRESSION: 1. No acute intracranial pathology. Mild age-related atrophy and chronic microvascular ischemic changes. 2. No acute/traumatic cervical spine pathology. Electronically Signed   By: Anner Crete M.D.   On: 12/02/2022 02:51   DG Chest Portable 1 View  Result Date: 12/02/2022 CLINICAL DATA:  Syncope. EXAM: PORTABLE CHEST 1 VIEW COMPARISON:  Chest radiograph dated 12/21/2020. FINDINGS: Shallow inspiration. There is eventration of the right hemidiaphragm. No focal consolidation, pleural effusion, or pneumothorax. Mild cardiomegaly. No acute  osseous pathology. IMPRESSION: 1. No active disease. 2. Mild cardiomegaly. Electronically Signed   By: Anner Crete M.D.   On: 12/02/2022 02:09     PROCEDURES:  Critical Care performed: Yes, see critical care procedure note(s)   CRITICAL CARE Performed by: Cyril Mourning Tamikka Pilger   Total critical care time: 40 minutes  Critical care time was exclusive of separately billable procedures and treating other patients.  Critical care was necessary to treat or prevent imminent or life-threatening deterioration.  Critical care was time spent personally by me on the following activities: development of treatment plan with patient and/or surrogate as well as nursing, discussions with consultants, evaluation of patient's response to treatment, examination of patient, obtaining history from patient or surrogate, ordering and performing treatments and interventions, ordering and review of laboratory studies, ordering and review of radiographic studies, pulse oximetry and re-evaluation of patient's condition.   Marland Kitchen1-3 Lead  EKG Interpretation  Performed by: Allee Busk, Delice Bison, DO Authorized by: Vicy Medico, Delice Bison, DO     Interpretation: normal     ECG rate:  80   ECG rate assessment: normal     Rhythm: sinus rhythm     Ectopy: none     Conduction: normal       IMPRESSION / MDM / ASSESSMENT AND PLAN / ED COURSE  I reviewed the triage vital signs and the nursing notes.    Patient here with recurrent syncope.  Patient found to be hypotensive.  Improving with IV fluids.  Son reports he thinks she is dehydrated from drinking alcohol all day.  The patient is on the cardiac monitor to evaluate for evidence of arrhythmia and/or significant heart rate changes.   DIFFERENTIAL DIAGNOSIS (includes but not limited to):   Dehydration, sepsis, anemia, electrolyte derangement, UTI, ACS, arrhythmia   Patient's presentation is most consistent with acute presentation with potential threat to life or bodily  function.   PLAN: Workup initiated from triage.  Patient has a metabolic acidosis with elevated anion gap but normal blood sugar.  Will check lactic.  Hemoglobin of 9 which she has had previously.  No gross blood or melena on rectal exam.  Troponin negative.  Will add on ethanol level, LFTs, repeat troponin, urinalysis, COVID, flu and RSV swabs.  Will obtain CT of the head and cervical spine and chest x-ray although she denies any injury from her syncopal events but she is not the best historian and son does not live with her and did not witness these events.  Will continue IV hydration.   MEDICATIONS GIVEN IN ED: Medications  LORazepam (ATIVAN) tablet 1-4 mg (has no administration in time range)    Or  LORazepam (ATIVAN) injection 1-4 mg (has no administration in time range)  thiamine (VITAMIN B1) tablet 100 mg (has no administration in time range)    Or  thiamine (VITAMIN B1) injection 100 mg (has no administration in time range)  folic acid (FOLVITE) tablet 1 mg (has no administration in time range)  multivitamin with minerals tablet 1 tablet (has no administration in time range)  sodium chloride flush (NS) 0.9 % injection 3 mL (3 mLs Intravenous Given 12/02/22 0207)  LORazepam (ATIVAN) injection 0-4 mg ( Intravenous Not Given 12/02/22 0208)    Followed by  LORazepam (ATIVAN) injection 0-4 mg (has no administration in time range)  dextrose 5 %-0.9 % sodium chloride infusion (has no administration in time range)  acetaminophen (TYLENOL) tablet 650 mg (has no administration in time range)    Or  acetaminophen (TYLENOL) suppository 650 mg (has no administration in time range)  ondansetron (ZOFRAN) tablet 4 mg (has no administration in time range)    Or  ondansetron (ZOFRAN) injection 4 mg (has no administration in time range)  cefTRIAXone (ROCEPHIN) 1 g in sodium chloride 0.9 % 100 mL IVPB (has no administration in time range)  nystatin cream (MYCOSTATIN) (has no administration in time  range)  sodium chloride 0.9 % bolus 1,000 mL (1,000 mLs Intravenous New Bag/Given 12/01/22 2259)  sodium chloride 0.9 % bolus 1,000 mL (0 mLs Intravenous Stopped 12/02/22 0223)  thiamine (VITAMIN B1) injection 100 mg (100 mg Intravenous Given 12/02/22 0206)     ED COURSE: Patient is negative for COVID, flu and influenza.  Procalcitonin slightly elevated at 0.22 but no obvious signs of infection currently.  Chest x-ray reviewed and interpreted by myself and the radiologist and shows no pneumonia.  CT  of the head and cervical spine also reviewed and interpreted by myself and the radiologist and show no traumatic injury.  Urine, lactic pending.  Will hold antibiotics at this time given blood pressures have improved and no other signs of infection currently.  She has no leukocytosis or leukopenia.  Her rectal temperature was normal.  Will discuss with hospitalist for admission.  I suspect her hypotension is likely due to dehydration from decreased oral intake in the setting of heavy alcohol use.   CONSULTS:  Consulted and discussed patient's case with hospitalist, Dr. Damita Dunnings.  I have recommended admission and consulting physician agrees and will place admission orders.  Patient (and family if present) agree with this plan.   I reviewed all nursing notes, vitals, pertinent previous records.  All labs, EKGs, imaging ordered have been independently reviewed and interpreted by myself.    OUTSIDE RECORDS REVIEWED: Reviewed patient's last admission in April 2022.       FINAL CLINICAL IMPRESSION(S) / ED DIAGNOSES   Final diagnoses:  Syncope and collapse  Hypotension, unspecified hypotension type     Rx / DC Orders   ED Discharge Orders     None        Note:  This document was prepared using Dragon voice recognition software and may include unintentional dictation errors.   Simi Briel, Delice Bison, DO 12/02/22 [phone]

## 2022-12-02 NOTE — Hospital Course (Signed)
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  She did not fall or sustain any injury.  She was reportedly drinking scotch all day.  EMS was called out for the first episode but patient refused to come in but after the second episode an hour later she obliged.  BP with EMS was 114/66.  She has had no vomiting or diarrhea, cough or congestion, fever or chills or dysuria. ED course and data review: BP was 90/60 on arrival with otherwise normal vitals.  After several hours waiting in the ED BP dropped to 88/51 but was fluid responsive after 2 L to 104/74.  Labs significant for hemoglobin of 9, baseline around 10 and creatinine of 1.10 which appears to be her baseline.  Urinalysis, COVID flu and RSV pending.  Lactic acid and procalcitonin as well as EtOH levels also pending.  EKG, personally viewed and interpreted showing sinus tachycardia at 108 with nonspecific ST-T wave changes. Chest x-ray nonacute and CT head pending Patient was back to normal mentation by admission.  She was given thiamine injection.  Hospitalist consulted
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
## 2022-12-03 ENCOUNTER — Encounter: Payer: Self-pay | Admitting: Internal Medicine

## 2022-12-03 DIAGNOSIS — R4182 Altered mental status, unspecified: Secondary | ICD-10-CM | POA: Diagnosis not present

## 2022-12-03 DIAGNOSIS — R55 Syncope and collapse: Secondary | ICD-10-CM | POA: Diagnosis not present

## 2022-12-03 DIAGNOSIS — I9589 Other hypotension: Secondary | ICD-10-CM

## 2022-12-03 DIAGNOSIS — E861 Hypovolemia: Secondary | ICD-10-CM

## 2022-12-03 DIAGNOSIS — F1020 Alcohol dependence, uncomplicated: Secondary | ICD-10-CM | POA: Diagnosis not present

## 2022-12-03 DIAGNOSIS — N61 Mastitis without abscess: Secondary | ICD-10-CM | POA: Diagnosis not present

## 2022-12-03 LAB — MAGNESIUM: Magnesium: 1.3 mg/dL — ABNORMAL LOW (ref 1.7–2.4)

## 2022-12-03 LAB — TYPE AND SCREEN
ABO/RH(D): A POS
Antibody Screen: NEGATIVE
Unit division: 0
Unit division: 0

## 2022-12-03 LAB — CBC WITH DIFFERENTIAL/PLATELET
Abs Immature Granulocytes: 0.01 10*3/uL (ref 0.00–0.07)
Abs Immature Granulocytes: 0.02 10*3/uL (ref 0.00–0.07)
Basophils Absolute: 0 10*3/uL (ref 0.0–0.1)
Basophils Absolute: 0 10*3/uL (ref 0.0–0.1)
Basophils Relative: 1 %
Basophils Relative: 1 %
Eosinophils Absolute: 0.1 10*3/uL (ref 0.0–0.5)
Eosinophils Absolute: 0.1 10*3/uL (ref 0.0–0.5)
Eosinophils Relative: 1 %
Eosinophils Relative: 2 %
HCT: 26.2 % — ABNORMAL LOW (ref 36.0–46.0)
HCT: 29.3 % — ABNORMAL LOW (ref 36.0–46.0)
Hemoglobin: 8.3 g/dL — ABNORMAL LOW (ref 12.0–15.0)
Hemoglobin: 9.3 g/dL — ABNORMAL LOW (ref 12.0–15.0)
Immature Granulocytes: 0 %
Immature Granulocytes: 0 %
Lymphocytes Relative: 16 %
Lymphocytes Relative: 18 %
Lymphs Abs: 0.8 10*3/uL (ref 0.7–4.0)
Lymphs Abs: 0.9 10*3/uL (ref 0.7–4.0)
MCH: 27.8 pg (ref 26.0–34.0)
MCH: 28.1 pg (ref 26.0–34.0)
MCHC: 31.7 g/dL (ref 30.0–36.0)
MCHC: 31.7 g/dL (ref 30.0–36.0)
MCV: 87.5 fL (ref 80.0–100.0)
MCV: 88.8 fL (ref 80.0–100.0)
Monocytes Absolute: 0.3 10*3/uL (ref 0.1–1.0)
Monocytes Absolute: 0.3 10*3/uL (ref 0.1–1.0)
Monocytes Relative: 7 %
Monocytes Relative: 7 %
Neutro Abs: 3.5 10*3/uL (ref 1.7–7.7)
Neutro Abs: 3.7 10*3/uL (ref 1.7–7.7)
Neutrophils Relative %: 72 %
Neutrophils Relative %: 75 %
Platelets: 122 10*3/uL — ABNORMAL LOW (ref 150–400)
Platelets: 140 10*3/uL — ABNORMAL LOW (ref 150–400)
RBC: 2.95 MIL/uL — ABNORMAL LOW (ref 3.87–5.11)
RBC: 3.35 MIL/uL — ABNORMAL LOW (ref 3.87–5.11)
RDW: 19.2 % — ABNORMAL HIGH (ref 11.5–15.5)
RDW: 19.6 % — ABNORMAL HIGH (ref 11.5–15.5)
WBC: 4.7 10*3/uL (ref 4.0–10.5)
WBC: 5 10*3/uL (ref 4.0–10.5)
nRBC: 0 % (ref 0.0–0.2)
nRBC: 0.4 % — ABNORMAL HIGH (ref 0.0–0.2)

## 2022-12-03 LAB — URINE CULTURE

## 2022-12-03 LAB — COMPREHENSIVE METABOLIC PANEL
ALT: 9 U/L (ref 0–44)
AST: 25 U/L (ref 15–41)
Albumin: 2.5 g/dL — ABNORMAL LOW (ref 3.5–5.0)
Alkaline Phosphatase: 38 U/L (ref 38–126)
Anion gap: 9 (ref 5–15)
BUN: 13 mg/dL (ref 8–23)
CO2: 23 mmol/L (ref 22–32)
Calcium: 8 mg/dL — ABNORMAL LOW (ref 8.9–10.3)
Chloride: 105 mmol/L (ref 98–111)
Creatinine, Ser: 0.95 mg/dL (ref 0.44–1.00)
GFR, Estimated: >60 mL/min (ref >60)
Glucose, Bld: 89 mg/dL (ref 70–99)
Potassium: 3.1 mmol/L — ABNORMAL LOW (ref 3.5–5.1)
Sodium: 137 mmol/L (ref 135–145)
Total Bilirubin: 2.9 mg/dL — ABNORMAL HIGH (ref 0.3–1.2)
Total Protein: 6 g/dL — ABNORMAL LOW (ref 6.5–8.1)

## 2022-12-03 LAB — BPAM RBC
Blood Product Expiration Date: 202401012359
Blood Product Expiration Date: 202401102359
ISSUE DATE / TIME: 202312261707
Unit Type and Rh: 600
Unit Type and Rh: 6200

## 2022-12-03 LAB — PROTIME-INR
INR: 1.1 (ref 0.8–1.2)
Prothrombin Time: 14.3 seconds (ref 11.4–15.2)

## 2022-12-03 LAB — APTT: aPTT: 22 seconds — ABNORMAL LOW (ref 24–36)

## 2022-12-03 LAB — AMMONIA: Ammonia: <10 umol/L (ref 9–35)

## 2022-12-03 MED ORDER — MAGNESIUM SULFATE 2 GM/50ML IV SOLN
2.0000 g | Freq: Once | INTRAVENOUS | Status: AC
  Administered 2022-12-03: 2 g via INTRAVENOUS
  Filled 2022-12-03: qty 50

## 2022-12-03 MED ORDER — POTASSIUM CHLORIDE 20 MEQ PO PACK
40.0000 meq | PACK | Freq: Once | ORAL | Status: DC
  Filled 2022-12-03: qty 2

## 2022-12-03 MED ORDER — SODIUM CHLORIDE 0.9 % IV SOLN: INTRAVENOUS | Status: AC

## 2022-12-03 MED ORDER — POTASSIUM CHLORIDE 10 MEQ/100ML IV SOLN
10.0000 meq | INTRAVENOUS | Status: AC
  Administered 2022-12-03 (×5): 10 meq via INTRAVENOUS
  Filled 2022-12-03 (×4): qty 100

## 2022-12-03 NOTE — Progress Notes (Signed)
Eeg done 

## 2022-12-03 NOTE — Procedures (Signed)
Patient Name: Sheri Butler  MRN: [id number]  Epilepsy Attending: Lora Havens  Referring Physician/Provider: Vernelle Emerald, MD  Date: 12/03/2022 Duration: 24.32 mins  Patient history: 76yo F with syncope. EEG to evaluate for seizure.  Level of alertness:  lethargic, asleep  AEDs during EEG study: Ativan  Technical aspects: This EEG study was done with scalp electrodes positioned according to the 10-20 International system of electrode placement. Electrical activity was reviewed with band pass filter of 1-'70Hz'$ , sensitivity of 7 uV/mm, display speed of 27m/sec with a '60Hz'$  notched filter applied as appropriate. EEG data were recorded continuously and digitally stored.  Video monitoring was available and reviewed as appropriate.  Description: No clear posterior dominant rhythm was seen. Sleep was characterized by sleep spindles (12-'14hz'$ ) maximal frontocentral region. EEG showed continuous generalized 3 to 6 Hz theta-delta slowing admixed with 13-'15Hz'$  generalized beta activity. Hyperventilation and photic stimulation were not performed.     ABNORMALITY - Continuous slow, generalized - Excessive beta, generalized  IMPRESSION: This study is suggestive of moderate to severe diffuse encephalopathy, nonspecific etiology. The excessive beta activity seen in the background is most likely due to the effect of benzodiazepine and is a benign EEG pattern. No seizures or epileptiform discharges were seen throughout the recording.   Kiyoko Mcguirt OBarbra Sarks

## 2022-12-03 NOTE — ED Notes (Signed)
Breakfast tray given to pt and pt encouraged to eat pt states "I need my sleep". NAD noted at this time. VSS.

## 2022-12-03 NOTE — Procedures (Signed)
Patient has wig sewn into hair-pt too drowsy to consent to me cutting it off. Not sure how without cutting hair.  Waiting to hear from ordering physician about what to do.

## 2022-12-03 NOTE — ED Notes (Signed)
EEG tech to bedside to perform EEG. Wig that pt is wearing is sewn into place. Provider notified. Pt unable to verbally consent to wig removal. Son, Levada Dy called and consenting to cut wig off. EEG tech notified.

## 2022-12-04 DIAGNOSIS — R55 Syncope and collapse: Secondary | ICD-10-CM | POA: Diagnosis not present

## 2022-12-04 LAB — CBC WITH DIFFERENTIAL/PLATELET
Abs Immature Granulocytes: 0.03 10*3/uL (ref 0.00–0.07)
Basophils Absolute: 0 10*3/uL (ref 0.0–0.1)
Basophils Relative: 0 %
Eosinophils Absolute: 0.1 10*3/uL (ref 0.0–0.5)
Eosinophils Relative: 2 %
HCT: 25.7 % — ABNORMAL LOW (ref 36.0–46.0)
Hemoglobin: 8.4 g/dL — ABNORMAL LOW (ref 12.0–15.0)
Immature Granulocytes: 1 %
Lymphocytes Relative: 14 %
Lymphs Abs: 0.8 10*3/uL (ref 0.7–4.0)
MCH: 27.9 pg (ref 26.0–34.0)
MCHC: 32.7 g/dL (ref 30.0–36.0)
MCV: 85.4 fL (ref 80.0–100.0)
Monocytes Absolute: 0.4 10*3/uL (ref 0.1–1.0)
Monocytes Relative: 7 %
Neutro Abs: 4.5 10*3/uL (ref 1.7–7.7)
Neutrophils Relative %: 76 %
Platelets: 163 10*3/uL (ref 150–400)
RBC: 3.01 MIL/uL — ABNORMAL LOW (ref 3.87–5.11)
RDW: 19.4 % — ABNORMAL HIGH (ref 11.5–15.5)
WBC: 5.9 10*3/uL (ref 4.0–10.5)
nRBC: 0 % (ref 0.0–0.2)

## 2022-12-04 LAB — MAGNESIUM: Magnesium: 1.7 mg/dL (ref 1.7–2.4)

## 2022-12-04 LAB — COMPREHENSIVE METABOLIC PANEL
ALT: 9 U/L (ref 0–44)
AST: 24 U/L (ref 15–41)
Albumin: 2.4 g/dL — ABNORMAL LOW (ref 3.5–5.0)
Alkaline Phosphatase: 44 U/L (ref 38–126)
Anion gap: 7 (ref 5–15)
BUN: 8 mg/dL (ref 8–23)
CO2: 22 mmol/L (ref 22–32)
Calcium: 8.3 mg/dL — ABNORMAL LOW (ref 8.9–10.3)
Chloride: 108 mmol/L (ref 98–111)
Creatinine, Ser: 0.69 mg/dL (ref 0.44–1.00)
GFR, Estimated: >60 mL/min (ref >60)
Glucose, Bld: 81 mg/dL (ref 70–99)
Potassium: 3.8 mmol/L (ref 3.5–5.1)
Sodium: 137 mmol/L (ref 135–145)
Total Bilirubin: 1 mg/dL (ref 0.3–1.2)
Total Protein: 5.7 g/dL — ABNORMAL LOW (ref 6.5–8.1)

## 2022-12-04 LAB — GLUCOSE, CAPILLARY: Glucose-Capillary: 90 mg/dL (ref 70–99)

## 2022-12-04 LAB — T4, FREE: Free T4: 1.03 ng/dL (ref 0.61–1.12)

## 2022-12-04 LAB — PREPARE RBC (CROSSMATCH)

## 2022-12-04 MED ORDER — ENOXAPARIN SODIUM 300 MG/3ML IJ SOLN: 0.5000 mg/kg | INTRAMUSCULAR | Status: DC

## 2022-12-04 MED ORDER — SODIUM CHLORIDE 0.9 % IV SOLN: INTRAVENOUS | Status: DC

## 2022-12-04 MED ORDER — ORAL CARE MOUTH RINSE: 15.0000 mL | OROMUCOSAL | Status: DC | PRN

## 2022-12-04 MED ORDER — ENOXAPARIN SODIUM 40 MG/0.4ML IJ SOSY
40.0000 mg | PREFILLED_SYRINGE | INTRAMUSCULAR | Status: DC
  Administered 2022-12-04 – 2022-12-07 (×4): 40 mg via SUBCUTANEOUS
  Filled 2022-12-04 (×4): qty 0.4

## 2022-12-04 NOTE — Progress Notes (Signed)
PROGRESS NOTE    [name]  ZMO:[id number] DOB: [birth year] DOA: 12/02/2022 PCP: Denton Lank, MD    Brief Narrative:   Sheri Butler is a 76 y.o. female with past medical history significant for HTN, trans anal rectal tubulovillous adenoma s/p resection [facility] 2019, alcohol use disorder who presented to [facility] ED on 12/26 following syncopal episode [name] to.  Witnessed by family.  Workup in the ED was notable for hypotension, tachycardia.  Chest x-ray and CT head with no acute findings.  TRH was consulted for admission for further evaluation and management of syncope and hypotension.  Assessment & Plan:   Acute metabolic encephalopathy Syncope Patient presenting to ED with syncopal episodes x 2 at home, confusion.  Was found to be hypotensive in the ED with associated tachycardia.  CT head and C-spine without contrast with no acute findings.  EEG with no epileptiform or seizure-like activity noted.  B12 within lower limits.  Afebrile without leukocytosis. -- Supportive care  Right breast cellulitis/intertrigo -- Ceftriaxone 1 g IV every 24 hours -- Nystatin cream twice daily  Sick euthyroid syndrome TSH elevated 4.540, free T4 normal at 1.03. -- Recommend repeat TFTs outpatient  Folic acid deficiency Folate low at 3.7. -- Folic acid 1 mg p.o. daily  Alcohol use disorder Son reports heavy use of hard liquor on a daily basis.  No reported history of alcohol withdrawal seizures or significant withdrawal symptoms in the past. -- CIWA protocol with symptom triggered Ativan, cautious use of Ativan given her encephalopathy. -- Discussed needs complete alcohol cessation  Essential hypertension -- Continue to hold home antihypertensives -- Monitor BP closely  Acute on chronic anemia No evidence of bleeding clinically, transfuse 1 unit PRBC on admission.  Suspect hemodilution superimposed on myelosuppression from chronic heavy alcohol use. --Hgb -- CBC daily, transfuse for  hemoglobin less than 7.0 rectal bleeding  Metabolic acidosis: Resolved Serum bicarbonate 17 admission, likely secondary to dehydration.  Now resolved. -- Supportive care  Weakness/debility/deconditioning Adult failure to thrive --NS at 48 MLS per hour --PT/OT evaluation: Pending   DVT prophylaxis: enoxaparin (LOVENOX) injection 40 mg Start: 12/04/22 1700    Code Status: Full Code Family Communication: Updated patient's son present at bedside this morning  Disposition Plan:  Level of care: Progressive Status is: Inpatient Remains inpatient appropriate because: Close monitoring of withdrawal symptoms, needs PT/OT evaluation, IV fluids    Consultants:  None  Procedures:  EEG  Antimicrobials:  Ceftriaxone 12/25>>   Subjective: Patient seen examined bedside, sleeping but arousable.  Remains confused.  Son present at bedside, reports poor living situation at home with her husband that is not helpful.  Reports that she is not hungry.  No specific complaints this morning.  Discussed with son workup has been relatively unremarkable, likely somnolence related to IV Ativan use.  Discussed need for complete alcohol cessation.  Awaiting PT/OT evaluation.  Objective: Vitals:   12/04/22 0816 12/04/22 1130 12/04/22 1703 12/04/22 1704  BP: 129/84 110/77 107/83   Pulse: 97 (!) 103 (!) 104   Resp: '17 18 18   '$ Temp: 98.4 F (36.9 C) 98.4 F (36.9 C)  99.5 F (37.5 C)  TempSrc:    Oral  SpO2: 99% 98% 99%   Weight:      Height:        Intake/Output Summary (Last 24 hours) at 12/04/2022 1711 Last data filed at 12/04/2022 1710 Gross per 24 hour  Intake 1711.52 ml  Output 950 ml  Net 761.52 ml  Filed Weights   12/03/22 2038 12/04/22 0500  Weight: 72.8 kg 73.1 kg    Examination:  Physical Exam: GEN: NAD, alert, chronically ill appearance, appears older than stated age HEENT: NCAT, PERRL, EOMI, sclera clear, MMM PULM: CTAB w/o wheezes/crackles, normal respiratory effort, on  room air with SpO2 98% at rest CV: RRR w/o M/G/R GI: abd soft, NTND, NABS, no R/G/M MSK: no peripheral edema, moves all extremities independently NEURO: CN II-XII intact, no focal deficits, sensation to light touch intact PSYCH: Depressed mood, flat affect Integumentary: Redness noted in bilateral breast folds, otherwise no other concerning rashes/lesions/wounds noted on exposed skin surfaces.      Data Reviewed: I have personally reviewed following labs and imaging studies  CBC: Recent Labs  Lab 12/01/22 1509 12/02/22 0530 12/02/22 1030 12/03/22 0020 12/03/22 0853 12/04/22 0430  WBC 6.6 5.5  --  4.7 5.0 5.9  NEUTROABS  --   --   --  3.5 3.7 4.5  HGB 9.0* 7.1* 7.0* 8.3* 9.3* 8.4*  HCT 29.4* 22.8* 22.5* 26.2* 29.3* 25.7*  MCV 89.4 89.8  --  88.8 87.5 85.4  PLT 169 148*  --  122* 140* 094   Basic Metabolic Panel: Recent Labs  Lab 12/01/22 1509 12/02/22 0530 12/03/22 0020 12/04/22 0430  NA 134* 139 137 137  K 3.9 3.5 3.1* 3.8  CL 95* 105 105 108  CO2 17* '22 23 22  '$ GLUCOSE 98 151* 89 81  BUN '18 18 13 8  '$ CREATININE 1.10* 1.07* 0.95 0.69  CALCIUM 9.3 7.9* 8.0* 8.3*  MG  --   --  1.3* 1.7   GFR: Estimated Creatinine Clearance: 48.7 mL/min (by C-G formula based on SCr of 0.69 mg/dL). Liver Function Tests: Recent Labs  Lab 12/02/22 0121 12/03/22 0020 12/04/22 0430  AST '27 25 24  '$ ALT '9 9 9  '$ ALKPHOS 38 38 44  BILITOT 1.7* 2.9* 1.0  PROT 6.6 6.0* 5.7*  ALBUMIN 2.7* 2.5* 2.4*   No results for input(s): "LIPASE", "AMYLASE" in the last 168 hours. Recent Labs  Lab 12/03/22 1612  AMMONIA <10   Coagulation Profile: Recent Labs  Lab 12/03/22 0020  INR 1.1   Cardiac Enzymes: No results for input(s): "CKTOTAL", "CKMB", "CKMBINDEX", "TROPONINI" in the last 168 hours. BNP (last 3 results) No results for input(s): "PROBNP" in the last 8760 hours. HbA1C: No results for input(s): "HGBA1C" in the last 72 hours. CBG: Recent Labs  Lab 12/04/22 0816  GLUCAP 90    Lipid Profile: No results for input(s): "CHOL", "HDL", "LDLCALC", "TRIG", "CHOLHDL", "LDLDIRECT" in the last 72 hours. Thyroid Function Tests: Recent Labs    12/02/22 1030 12/04/22 0428  TSH 4.540*  --   FREET4  --  1.03   Anemia Panel: Recent Labs    12/02/22 1002 12/02/22 1030  VITAMINB12 783  --   FOLATE  --  3.7*  FERRITIN  --  168  TIBC  --  223*  IRON  --  93  RETICCTPCT 1.0  --    Sepsis Labs: Recent Labs  Lab 12/02/22 0121 12/02/22 0244 12/02/22 0530  PROCALCITON 0.22  --   --   LATICACIDVEN  --  1.2 1.2    Recent Results (from the past 240 hour(s))  Resp panel by RT-PCR (RSV, Flu A&B, Covid) Anterior Nasal Swab     Status: None   Collection Time: 12/02/22  1:21 AM   Specimen: Anterior Nasal Swab  Result Value Ref Range Status   SARS Coronavirus 2  by RT PCR NEGATIVE NEGATIVE Final    Comment: (NOTE) SARS-CoV-2 target nucleic acids are NOT DETECTED.  The SARS-CoV-2 RNA is generally detectable in upper respiratory specimens during the acute phase of infection. The lowest concentration of SARS-CoV-2 viral copies this assay can detect is 138 copies/mL. A negative result does not preclude SARS-Cov-2 infection and should not be used as the sole basis for treatment or other patient management decisions. A negative result may occur with  improper specimen collection/handling, submission of specimen other than nasopharyngeal swab, presence of viral mutation(s) within the areas targeted by this assay, and inadequate number of viral copies(<138 copies/mL). A negative result must be combined with clinical observations, patient history, and epidemiological information. The expected result is Negative.  Fact Sheet for Patients:  EntrepreneurPulse.com.au  Fact Sheet for Healthcare Providers:  IncredibleEmployment.be  This test is no t yet approved or cleared by the [place] FDA and  has been authorized for detection  and/or diagnosis of SARS-CoV-2 by FDA under an Emergency Use Authorization (EUA). This EUA will remain  in effect (meaning this test can be used) for the duration of the COVID-19 declaration under Section 564(b)(1) of the Act, 21 U.S.C.section 360bbb-3(b)(1), unless the authorization is terminated  or revoked sooner.       Influenza A by PCR NEGATIVE NEGATIVE Final   Influenza B by PCR NEGATIVE NEGATIVE Final    Comment: (NOTE) The Xpert Xpress SARS-CoV-2/FLU/RSV plus assay is intended as an aid in the diagnosis of influenza from Nasopharyngeal swab specimens and should not be used as a sole basis for treatment. Nasal washings and aspirates are unacceptable for Xpert Xpress SARS-CoV-2/FLU/RSV testing.  Fact Sheet for Patients: EntrepreneurPulse.com.au  Fact Sheet for Healthcare Providers: IncredibleEmployment.be  This test is not yet approved or cleared by the [place] FDA and has been authorized for detection and/or diagnosis of SARS-CoV-2 by FDA under an Emergency Use Authorization (EUA). This EUA will remain in effect (meaning this test can be used) for the duration of the COVID-19 declaration under Section 564(b)(1) of the Act, 21 U.S.C. section 360bbb-3(b)(1), unless the authorization is terminated or revoked.     Resp Syncytial Virus by PCR NEGATIVE NEGATIVE Final    Comment: (NOTE) Fact Sheet for Patients: EntrepreneurPulse.com.au  Fact Sheet for Healthcare Providers: IncredibleEmployment.be  This test is not yet approved or cleared by the [place] FDA and has been authorized for detection and/or diagnosis of SARS-CoV-2 by FDA under an Emergency Use Authorization (EUA). This EUA will remain in effect (meaning this test can be used) for the duration of the COVID-19 declaration under Section 564(b)(1) of the Act, 21 U.S.C. section 360bbb-3(b)(1), unless the authorization is terminated  or revoked.  Performed at [facility], [address] [place], [place] [address]   Urine Culture     Status: Abnormal   Collection Time: 12/02/22  3:08 AM   Specimen: Urine, Clean Catch  Result Value Ref Range Status   Specimen Description   Final    URINE, CLEAN CATCH Performed at [facility], [address] [place], [place] [address]    Special Requests   Final    NONE Performed at [facility], [address] [place], [place] [address]    Culture MULTIPLE SPECIES PRESENT, SUGGEST RECOLLECTION (A)  Final   Report Status December 11, 2022 FINAL  Final         Radiology Studies: EEG adult  Result Date: December 11, 2022 Lora Havens, MD     December 11, 2022  5:02 PM Patient Name: Sheri Butler MRN: [id number] Epilepsy Attending: Lora Havens Referring Physician/Provider: Vernelle Emerald, MD Date: 12/03/2022 Duration: 24.32 mins Patient history: 76yo F with syncope. EEG to evaluate for seizure. Level of alertness:  lethargic, asleep AEDs during EEG study: Ativan Technical aspects: This EEG study was done with scalp electrodes positioned according to the 10-20 International system of electrode placement. Electrical activity was reviewed with band pass filter of 1-'70Hz'$ , sensitivity of 7 uV/mm, display speed of 79m/sec with a '60Hz'$  notched filter applied as appropriate. EEG data were recorded continuously and digitally stored.  Video monitoring was available and reviewed as appropriate. Description: No clear posterior dominant rhythm was seen. Sleep was characterized by sleep spindles (12-'14hz'$ ) maximal frontocentral region. EEG showed continuous generalized 3 to 6 Hz theta-delta slowing admixed with 13-'15Hz'$  generalized beta activity. Hyperventilation and photic stimulation were not performed.   ABNORMALITY - Continuous slow, generalized - Excessive beta, generalized IMPRESSION: This study is suggestive of moderate to severe diffuse encephalopathy, nonspecific  etiology. The excessive beta activity seen in the background is most likely due to the effect of benzodiazepine and is a benign EEG pattern. No seizures or epileptiform discharges were seen throughout the recording. Priyanka OBarbra Sarks       Scheduled Meds:  sodium chloride   Intravenous Once   enoxaparin (LOVENOX) injection  40 mg Subcutaneous QL75P  folic acid  1 mg Oral Daily   LORazepam  0-4 mg Intravenous Q12H   multivitamin with minerals  1 tablet Oral Daily   nystatin cream   Topical BID   sodium chloride flush  3 mL Intravenous Q12H   thiamine  100 mg Oral Daily   Or   thiamine  100 mg Intravenous Daily   Continuous Infusions:  sodium chloride 75 mL/hr at 12/04/22 1710   cefTRIAXone (ROCEPHIN)  IV Stopped (12/04/22 1543)     LOS: 2 days    Time spent: 51 minutes spent on chart review, discussion with nursing staff, consultants, updating family and interview/physical exam; more than 50% of that time was spent in counseling and/or coordination of care.    Jahmel Flannagan J [place]rit[place] DO [facility] Available via Epic secure chat 7am-7pm After these hours, please refer to coverage provider listed on [email or web address] 12/04/2022, 5:11 PM

## 2022-12-04 NOTE — Progress Notes (Signed)
PHARMACIST - PHYSICIAN COMMUNICATION  CONCERNING:  Enoxaparin (Lovenox) for DVT Prophylaxis    RECOMMENDATION: Patient was prescribed enoxaprin '40mg'$  q24 hours for VTE prophylaxis.   Filed Weights   12/03/22 2038 12/04/22 0500  Weight: 72.8 kg (160 lb 9.6 oz) 73.1 kg (161 lb 2.5 oz)    Body mass index is 34.87 kg/m.  Estimated Creatinine Clearance: 48.7 mL/min (by C-G formula based on SCr of 0.69 mg/dL).   Based on [place] patient is candidate for enoxaparin 0.'5mg'$ /kg TBW SQ every 24 hours based on BMI being >30, but calculated dose will be '35mg'$  [place] q 24hrs.   Will proceed with enoxaparin '40mg'$  [place] daily, CrCl = 55m/min  DESCRIPTION: [facility].  Patient is now receiving enoxaparin 40 mg every 24 hours    Riley Hallum Rodriguez-Guzman PharmD, BCPS 12/04/2022 2:10 PM

## 2022-12-04 NOTE — Progress Notes (Signed)
  Progress Note   Patient: Sheri Butler MHD:[id number] DOB: November 11, 1946 DOA: 12/02/2022     2 DOS: the patient was seen and examined on 12/04/2022   Brief hospital course: PAMI WOOL is a 76 y.o. female with medical history significant for Hypertension,  history of trans-anal rectal tubovillous adenoma resection at [facility] in 2019, alcohol use disorder, who presented with a syncopal episode x 2 witnessed by family and was found to have hypotensive episodes in the ED, tachycardia with unremarkable findings on chest x-ray and CT head.  Patient was admitted for evaluation of syncope and hypotension  Assessment and Plan: Reported altered mental status and syncope, improving Presumed secondary to heavy alcohol drinking, workup today has been unremarkable the patient has had recurrent episode of altered mentation in the hospitalt that is likely worsened in the setting of Ativan while on CIWA protocol.  Ammonia, B12, and TSH within normal limits - Further space out as needed Ativan (CIWA protocol) as patient seems very sensitive to that -Continue scheduled IV hydration, monitor volume status in a.m. - Repeat orthostatic vitals on 12/28 - Follow-up EEG   Right breast intertrigo with concern for possible cellulitis This could be potential source for altered mental status during evaluation no obvious exudate, remains afebrile - Will continue Rocephin, nystatin cream     Acute on chronic anemia.  No evidence of bleeding clinically to require 1 unit PRBC on admission.  Has not needed repeat blood transfusion as hemoglobin has remained stable at baseline. Suspect hemodilution superimposed on myelosuppression from chronic heavy alcohol abuse.   -Monitor CBC --pending hemoccult  Metabolic acidosis Bicarb of 17, suspecting dehydration IV hydration and monitor  Alcohol use disorder, moderate, dependence (HCC) CIWA withdrawal protocol per admitting provider -space out ativan prn protocol as patient  seems very sensitive to this  HTN (hypertension) Hold home antihypertensives due to hypotension        Subjective: no complaints. Son at bedside  Physical Exam: Vitals:   12/03/22 1924 12/03/22 2038 12/03/22 2038 12/04/22 0045  BP: 118/84  119/82 (!) 147/88  Pulse: 86  (!) 101 92  Resp: '16  19 19  '$ Temp:   (!) 97.5 F (36.4 C) 97.9 F (36.6 C)  TempSrc:   Axillary Axillary  SpO2: 99%  100% 98%  Weight:  72.8 kg    Height:  '4\' 9"'$  (1.448 m)     Constitutional: Lethargic but arousable, oriented x 2, no associated distress.Dry oral mucosa Skin: no rashes, no lesions, poor skin turgor noted. Abdomen: Abdomen is soft and nontender.  No evidence of intra-abdominal masses.  Positive bowel sounds noted in all quadrants Data Reviewed:  There are no new results to review at this time.  Family Communication: son updated at bedside  Disposition: Status is: Inpatient Remains inpatient appropriate because: close moniotring of mentation, BP, infection, and hemoglobin  Planned Discharge Destination:  pending PT eval once clinically stable      Author: Desiree Hane, MD 12/03/2022 8:45 PM  For on call review [email or web address].

## 2022-12-04 NOTE — Evaluation (Signed)
Physical Therapy Evaluation Patient Details Name: Sheri Butler MRN: [id number] DOB: 01-07-1946 Today's Date: 12/04/2022  History of Present Illness  Sheri Butler is a 76 y.o. female with medical history significant for Hypertension,  history of trans-anal rectal tubovillous adenoma resection at [facility] in 2019, alcohol use disorder, who presents with a syncopal episode x 2.  She did not fall or sustain any injury.  She was reportedly drinking scotch all day.  EMS was called out for the first episode but patient refused to come in but after the second episode an hour later she obliged.  BP with EMS was 114/66. ED course and data review: BP was 90/60 on arrival with otherwise normal vitals.  After several hours waiting in the ED BP dropped to 88/51 but was fluid responsive after 2 L to 104/74.  Clinical Impression  Pt received in bed with Son by her side who is pt's POA consented for PT evaluation.   Pt lethargic and difficult to arouse. Pt lives with her friend and was Independent Ambulator at household level activities.  Pt's daughter help pt with shower and Meds. Pt's Son want her Mother to return to his younger brothers home when ready. Pt has good family support. Today's assessment revealed pt needs max to total assist with bed mobility and pain in B knees with movement. Pt's HR remained between 113 to 115 at rest and while sitting with total assist. PT deferred pt for STS and ambulation. As per nurse, pt received pain meds a little while ago. Nurse made aware of high HR. Pt will benefit form BP monitoring and further assessment of functional mobility. PT Recommendations TBD when pt is medically more appropriate and able to participate in functional mobility. .      Recommendations for follow up therapy are one component of a multi-disciplinary discharge planning process, led by the attending physician.  Recommendations may be updated based on patient status, additional functional criteria and  insurance authorization.  Follow Up Recommendations Other (comment) (TBD when pt  is arousable and able to actively particiapte with HR WNL)      Assistance Recommended at Discharge Intermittent Supervision/Assistance  Patient can return home with the following  Two people to help with walking and/or transfers;Two people to help with bathing/dressing/bathroom;Assistance with cooking/housework;Assistance with feeding;Direct supervision/assist for medications management;Assist for transportation;Help with stairs or ramp for entrance    Equipment Recommendations None recommended by PT  Recommendations for Other Services       Functional Status Assessment Patient has had a recent decline in their functional status and demonstrates the ability to make significant improvements in function in a reasonable and predictable amount of time.     Precautions / Restrictions Precautions Precautions: Fall Restrictions Weight Bearing Restrictions: No      Mobility  Bed Mobility Overal bed mobility: Needs Assistance Bed Mobility: Rolling, Supine to Sit Rolling: Total assist   Supine to sit: Total assist     General bed mobility comments: Pt in pain and grimicing the entire time.    Transfers: deferred                   General transfer comment: deferred 2/2 to HR 115 at rest.    Ambulation/Gait: deferred               General Gait Details: deferred  Stairs: deferred            Wheelchair Mobility    Modified Rankin (Stroke Patients Only)  Balance                                             Pertinent Vitals/Pain Pain Assessment Pain Assessment: Faces Pain Score: 8  Pain Location: B knees Pain Intervention(s): Premedicated before session    Home Living Family/patient expects to be discharged to:: Private residence Living Arrangements: Other relatives Available Help at Discharge: Available 24 hours/day Type of Home:  House Home Access: Stairs to enter Entrance Stairs-Rails: Right (one rail) Technical brewer of Steps: 2   Home Layout: One level        Prior Function Prior Level of Function : Independent/Modified Independent             Mobility Comments: Pt independent at Household level ambulation with FWW. ADLs Comments: Pt's daughter helps pt with melas and Bathing.     Hand Dominance        Extremity/Trunk Assessment   Upper Extremity Assessment Upper Extremity Assessment: Defer to OT evaluation    Lower Extremity Assessment Lower Extremity Assessment: Generalized weakness       Communication   Communication:  (Pt lethargic.)  Cognition Arousal/Alertness: Lethargic, Suspect due to medications   Overall Cognitive Status: Difficult to assess                                          General Comments      Exercises     Assessment/Plan    PT Assessment Patient needs continued PT services  PT Problem List Decreased strength;Decreased range of motion;Decreased activity tolerance;Decreased balance;Decreased mobility;Decreased cognition;Decreased safety awareness;Pain;Cardiopulmonary status limiting activity       PT Treatment Interventions Gait training;Stair training;Functional mobility training;Therapeutic activities;Therapeutic exercise;Balance training;Patient/family education;Cognitive remediation    PT Goals (Current goals can be found in the Care Plan section)  Acute Rehab PT Goals Patient Stated Goal: Unable (Son present and stated: " She can go yo my Younger brothers home when she is ready.") PT Goal Formulation: With patient/family Time For Goal Achievement: 12/18/22 Potential to Achieve Goals: Fair    Frequency Min 2X/week     Co-evaluation               AM-PAC PT "6 Clicks" Mobility  Outcome Measure Help needed turning from your back to your side while in a flat bed without using bedrails?: A Lot Help needed moving from  lying on your back to sitting on the side of a flat bed without using bedrails?: Total Help needed moving to and from a bed to a chair (including a wheelchair)?: Total Help needed standing up from a chair using your arms (e.g., wheelchair or bedside chair)?: Total Help needed to walk in hospital room?: Total Help needed climbing 3-5 steps with a railing? : Total 6 Click Score: 7    End of Session   Activity Tolerance: Patient limited by pain;Treatment limited secondary to medical complications (Comment) Patient left: in bed;with call bell/phone within reach;with bed alarm set;with family/visitor present Nurse Communication: Mobility status PT Visit Diagnosis: Unsteadiness on feet (R26.81);Muscle weakness (generalized) (M62.81);History of falling (Z91.81);Difficulty in walking, not elsewhere classified (R26.2);Pain Pain - Right/Left: Right Pain - part of body: Knee    Time: 0132-0142 PT Time Calculation (min) (ACUTE ONLY): 10 min   Charges:   PT  Evaluation $PT Eval Moderate Complexity: 1 Mod         Geran Haithcock PT DPT 2:45 PM,12/04/22

## 2022-12-04 NOTE — Progress Notes (Signed)
OT Cancellation Note  Patient Details Name: Sheri Butler MRN: [id number] DOB: [birth year]   Cancelled Treatment:    Reason Eval/Treat Not Completed: Medical issues which prohibited therapy. Order received, chart reviewed. Per conversation with PT, pt tachy at rest (HR 115 in bed) and lethargic during limited mobility attempts, not appropriate for further assessment this date. Will re-attempt next date as able.   Dessie Coma, M.S. OTR/L  12/04/22, 2:21 PM  ascom [phone]

## 2022-12-05 DIAGNOSIS — R55 Syncope and collapse: Secondary | ICD-10-CM | POA: Diagnosis not present

## 2022-12-05 LAB — CBC
HCT: 25.8 % — ABNORMAL LOW (ref 36.0–46.0)
Hemoglobin: 8.2 g/dL — ABNORMAL LOW (ref 12.0–15.0)
MCH: 27.5 pg (ref 26.0–34.0)
MCHC: 31.8 g/dL (ref 30.0–36.0)
MCV: 86.6 fL (ref 80.0–100.0)
Platelets: 153 10*3/uL (ref 150–400)
RBC: 2.98 MIL/uL — ABNORMAL LOW (ref 3.87–5.11)
RDW: 19.3 % — ABNORMAL HIGH (ref 11.5–15.5)
WBC: 6.2 10*3/uL (ref 4.0–10.5)
nRBC: 0 % (ref 0.0–0.2)

## 2022-12-05 NOTE — Plan of Care (Signed)

## 2022-12-05 NOTE — Evaluation (Signed)
Occupational Therapy Evaluation Patient Details Name: Sheri Butler MRN: [id number] DOB: 1946/03/11 Today's Date: 12/05/2022   History of Present Illness Sheri Butler is a 76 y.o. female with medical history significant for Hypertension,  history of trans-anal rectal tubovillous adenoma resection at [facility] in 2019, alcohol use disorder, who presents with a syncopal episode x 2.  She did not fall or sustain any injury.  She was reportedly drinking scotch all day.  EMS was called out for the first episode but patient refused to come in but after the second episode an hour later she obliged.  BP with EMS was 114/66. ED course and data review: BP was 90/60 on arrival with otherwise normal vitals.  After several hours waiting in the ED BP dropped to 88/51 but was fluid responsive after 2 L to 104/74.   Clinical Impression   Sheri Butler is extremely lethargic this AM, largely unable to communicate, although she does state that the year is 2023. She requires Total A for bed mobility. Asked to raise her arms, she lifts her hands and forearms ~ 2" off blanket. Asked if she is having any pain, pt shakes her head "no." These are the only movements pt makes IND during session. She does not exhibit any behaviors, noises, movements indicative of pain. Per son, who is present in room, pt lives in a house where two other family members live intermittently. Pt is often home alone and does not leave the house. Son reports that she is able to get a "dressing gown" on and off and can walk with RW to bathroom and kitchen. Daughter comes by occasionally and assists pt with showering, as well as handling grocery shopping. Son reports pt has frequent falls. At present, pt is unable to take care of self and does not have a safe home environment. She requires Max-Total A for all ADL and fxl mobility tasks. Recommend DC to SNF.   Recommendations for follow up therapy are one component of a multi-disciplinary discharge planning  process, led by the attending physician.  Recommendations may be updated based on patient status, additional functional criteria and insurance authorization.   Follow Up Recommendations  Skilled nursing-short term rehab (<3 hours/day)     Assistance Recommended at Discharge Frequent or constant Supervision/Assistance  Patient can return home with the following Two people to help with walking and/or transfers;Two people to help with bathing/dressing/bathroom;Direct supervision/assist for medications management;Help with stairs or ramp for entrance;Assist for transportation;Assistance with feeding;Assistance with cooking/housework;Direct supervision/assist for financial management    Functional Status Assessment  Patient has had a recent decline in their functional status and demonstrates the ability to make significant improvements in function in a reasonable and predictable amount of time.  Equipment Recommendations  None recommended by OT    Recommendations for Other Services       Precautions / Restrictions Precautions Precautions: Fall Restrictions Weight Bearing Restrictions: No      Mobility Bed Mobility Overal bed mobility: Needs Assistance Bed Mobility: Rolling, Sit to Supine, Supine to Sit Rolling: Total assist   Supine to sit: Total assist Sit to supine: Total assist        Transfers                   General transfer comment: unable      Balance Overall balance assessment: Needs assistance   Sitting balance-Leahy Scale: Zero       Standing balance-Leahy Scale: Zero  ADL either performed or assessed with clinical judgement   ADL                                               Vision         Perception     Praxis      Pertinent Vitals/Pain Pain Assessment Pain Assessment: No/denies pain     Hand Dominance     Extremity/Trunk Assessment Upper Extremity Assessment Upper  Extremity Assessment: Generalized weakness   Lower Extremity Assessment Lower Extremity Assessment: Generalized weakness       Communication Communication Communication: Other (comment) (Pt very lethargic, unable to communicate clearly.)   Cognition Arousal/Alertness: Lethargic Behavior During Therapy: Flat affect Overall Cognitive Status: Difficult to assess                                 General Comments: Pt unable to respond to most questions and to most one-step directions.     General Comments       Exercises Other Exercises Other Exercises: Educ w/ son re: DC recs   Shoulder Instructions      Home Living Family/patient expects to be discharged to:: Private residence Living Arrangements: Spouse/significant other;Other relatives Available Help at Discharge: Available PRN/intermittently Type of Home: House Home Access: Stairs to enter Entrance Stairs-Number of Steps: 2   Home Layout: One level     Bathroom Shower/Tub: Teacher, early years/pre: Standard Bathroom Accessibility: No   Home Equipment: Conservation officer, nature (2 wheels)   Additional Comments: Pt is unable to provide any information re: household setup or PLOF. All info provided by pt's oldest son, who is present throughout session.      Prior Functioning/Environment Prior Level of Function : Needs assist             Mobility Comments: Per son at bedside, pt walks short distances in the house with RW; states that she never leaves the house. Frequent falls. ADLs Comments: Per son at bedside, pt's daughter assists with showering, grocery shopping. Reports that pt wears a gown at all times, is able to change it herself, can use toilet INDly, can get to kitchen and heat foods in microwave.        OT Problem List: Decreased strength;Decreased range of motion;Decreased activity tolerance;Impaired balance (sitting and/or standing);Decreased cognition      OT Treatment/Interventions:       OT Goals(Current goals can be found in the care plan section) Acute Rehab OT Goals Patient Stated Goal: Wants pt to eat OT Goal Formulation: With family Time For Goal Achievement: 12/19/22 Potential to Achieve Goals: Fair ADL Goals Pt Will Perform Eating: sitting;with min assist Pt Will Perform Grooming: sitting;with min assist Pt Will Perform Upper Body Dressing: with min assist;sitting  OT Frequency: Min 2X/week    Co-evaluation              AM-PAC OT "6 Clicks" Daily Activity     Outcome Measure Help from another person eating meals?: A Lot Help from another person taking care of personal grooming?: Total Help from another person toileting, which includes using toliet, bedpan, or urinal?: Total Help from another person bathing (including washing, rinsing, drying)?: Total Help from another person to put on and taking off regular upper body clothing?: Total Help  from another person to put on and taking off regular lower body clothing?: Total 6 Click Score: 7   End of Session    Activity Tolerance: Patient limited by lethargy Patient left: in bed;with family/visitor present;with call bell/phone within reach  OT Visit Diagnosis: Other abnormalities of gait and mobility (R26.89);Muscle weakness (generalized) (M62.81);Feeding difficulties (R63.3);Other symptoms and signs involving cognitive function                Time: 1002-1017 OT Time Calculation (min): 15 min Charges:  OT General Charges $OT Visit: 1 Visit OT Evaluation $OT Eval Low Complexity: 1 Low OT Treatments $Self Care/Home Management : 8-22 mins Josiah Lobo, PhD, MS, OTR/L 12/05/22, 11:56 AM
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
## 2022-12-05 NOTE — Progress Notes (Signed)
PROGRESS NOTE    JULIETH Butler  YWV:[id number] DOB: 11/15/1946 DOA: 12/02/2022 PCP: Sheri Lank, MD    Brief Narrative:   Sheri Butler is a 76 y.o. 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  Witnessed by family.  Workup in the ED was notable for hypotension, tachycardia.  Chest x-ray and CT head with no acute findings.  TRH was consulted for admission for further evaluation and management of syncope and hypotension.  Assessment & Plan:   Acute metabolic encephalopathy Syncope Patient presenting to ED with syncopal episodes x 2 at home, confusion.  Was found to be hypotensive in the ED with associated tachycardia.  CT head and C-spine without contrast with no acute findings.  EEG with no epileptiform or seizure-like activity noted.  B12 within lower limits.  Afebrile without leukocytosis. -- Supportive care  Right breast cellulitis/intertrigo -- Ceftriaxone 1 g IV every 24 hours -- Nystatin cream twice daily  Sick euthyroid syndrome TSH elevated 4.540, free T4 normal at 1.03. -- Recommend repeat TFTs outpatient  Folic acid deficiency Folate low at 3.7. -- Folic acid 1 mg p.o. daily  Alcohol use disorder Son reports heavy use of hard liquor on a daily basis.  No reported history of alcohol withdrawal seizures or significant withdrawal symptoms in the past. -- CIWA protocol with symptom triggered Ativan, cautious use of Ativan given her encephalopathy. -- Discussed needs complete alcohol cessation  Essential hypertension -- Continue to hold home antihypertensives -- Monitor BP closely  Acute on chronic anemia No evidence of bleeding clinically, transfuse 1 unit PRBC on admission.  Suspect hemodilution superimposed on myelosuppression from chronic heavy alcohol use. -- Hgb 7.1>7.0>>9.3>8.4>8.2 --  Continue intermittent monitoring of hemoglobin -- transfuse for hemoglobin less than 7.0 rectal bleeding  Metabolic acidosis: Resolved Serum bicarbonate 17 admission, likely secondary to dehydration.  Now resolved. -- Supportive care  Weakness/debility/deconditioning Adult failure to thrive --NS at 125 mL/h --PT/OT evaluation: Pending   DVT prophylaxis: enoxaparin (LOVENOX) injection 40 mg Start: 12/04/22 1700    Code Status: Full Code Family Communication: Updated patient's son present at bedside this morning  Disposition Plan:  Level of care: Progressive Status is: Inpatient Remains inpatient appropriate because: Close monitoring of withdrawal symptoms, needs PT/OT evaluation, IV fluids    Consultants:  None  Procedures:  EEG  Antimicrobials:  Ceftriaxone 12/25>>   Subjective: Patient seen examined bedside, sleeping but arousable.  Remains confused.  Received IV Ativan this morning for agitation.   No specific complaints this morning.  Discussed with son workup has been relatively unremarkable, likely somnolence related to IV Ativan use.  Discussed need for complete alcohol cessation.  Awaiting PT/OT evaluation.  No acute concerns overnight per nursing staff.  Objective: Vitals:   12/04/22 1944 12/05/22 0114 12/05/22 0449 12/05/22 0736  BP: 100/72 95/69 108/66 117/63  Pulse: (!) 104 100 93 92  Resp: '16 17 18 18  '$ Temp: 99.7 F (37.6 C) 100.1 F (37.8 C) 99.5 F (37.5 C) 99.4 F (37.4 C)  TempSrc:  Oral Oral Oral  SpO2: 100% 97% 99% 98%  Weight:      Height:        Intake/Output Summary (Last 24 hours) at 12/05/2022 1036 Last data filed at 12/05/2022 0900 Gross per 24 hour  Intake 906.99 ml  Output 1450 ml  Net -543.01 ml   [name]  Weights   12/03/22 2038 12/04/22 0500  Weight: 72.8 kg 73.1 kg    Examination:  Physical Exam: GEN: NAD, alert, chronically ill appearance, appears older than stated age, confused HEENT: NCAT, PERRL, EOMI, sclera clear,  MMM PULM: CTAB w/o wheezes/crackles, normal respiratory effort, on room air with SpO2 99% at rest CV: RRR w/o M/G/R GI: abd soft, NTND, NABS, no R/G/M MSK: no peripheral edema, moves all extremities independently NEURO: CN II-XII intact, no focal deficits, sensation to light touch intact PSYCH: Depressed mood, flat affect Integumentary: Redness noted in bilateral breast folds, otherwise no other concerning rashes/lesions/wounds noted on exposed skin surfaces.      Data Reviewed: I have personally reviewed following labs and imaging studies  CBC: Recent Labs  Lab 12/02/22 0530 12/02/22 1030 12/03/22 0020 12/03/22 0853 12/04/22 0430 12/05/22 0437  WBC 5.5  --  4.7 5.0 5.9 6.2  NEUTROABS  --   --  3.5 3.7 4.5  --   HGB 7.1* 7.0* 8.3* 9.3* 8.4* 8.2*  HCT 22.8* 22.5* 26.2* 29.3* 25.7* 25.8*  MCV 89.8  --  88.8 87.5 85.4 86.6  PLT 148*  --  122* 140* 163 784   Basic Metabolic Panel: Recent Labs  Lab 12/01/22 1509 12/02/22 0530 12/03/22 0020 12/04/22 0430  NA 134* 139 137 137  K 3.9 3.5 3.1* 3.8  CL 95* 105 105 108  CO2 17* '22 23 22  '$ GLUCOSE 98 151* 89 81  BUN '18 18 13 8  '$ CREATININE 1.10* 1.07* 0.95 0.69  CALCIUM 9.3 7.9* 8.0* 8.3*  MG  --   --  1.3* 1.7   GFR: Estimated Creatinine Clearance: 48.7 mL/min (by C-G formula based on SCr of 0.69 mg/dL). Liver Function Tests: Recent Labs  Lab 12/02/22 0121 12/03/22 0020 12/04/22 0430  AST '27 25 24  '$ ALT '9 9 9  '$ ALKPHOS 38 38 44  BILITOT 1.7* 2.9* 1.0  PROT 6.6 6.0* 5.7*  ALBUMIN 2.7* 2.5* 2.4*   No results for input(s): "LIPASE", "AMYLASE" in the last 168 hours. Recent Labs  Lab 12/03/22 1612  AMMONIA <10   Coagulation Profile: Recent Labs  Lab 12/03/22 0020  INR 1.1   Cardiac Enzymes: No results for input(s): "CKTOTAL", "CKMB", "CKMBINDEX", "TROPONINI" in the last 168 hours. BNP (last 3 results) No results for input(s): "PROBNP" in the last 8760 hours. HbA1C: No results for input(s): "HGBA1C" in the  last 72 hours. CBG: Recent Labs  Lab 12/04/22 0816  GLUCAP 90   Lipid Profile: No results for input(s): "CHOL", "HDL", "LDLCALC", "TRIG", "CHOLHDL", "LDLDIRECT" in the last 72 hours. Thyroid Function Tests: Recent Labs    12/04/22 0428  FREET4 1.03   Anemia Panel: No results for input(s): "VITAMINB12", "FOLATE", "FERRITIN", "TIBC", "IRON", "RETICCTPCT" in the last 72 hours.  Sepsis Labs: Recent Labs  Lab 12/02/22 0121 12/02/22 0244 12/02/22 0530  PROCALCITON 0.22  --   --   LATICACIDVEN  --  1.2 1.2    Recent Results (from the past 240 hour(s))  Resp panel by RT-PCR (RSV, Flu A&B, Covid) Anterior Nasal Swab     Status: None   Collection Time: 12/02/22  1:21 AM   Specimen: Anterior Nasal Swab  Result Value Ref Range Status   SARS Coronavirus 2 by RT PCR NEGATIVE NEGATIVE Final    Comment: (NOTE) SARS-CoV-2 target nucleic acids are NOT DETECTED.  The SARS-CoV-2 RNA is generally detectable in upper respiratory specimens during the acute phase of infection. The lowest concentration of SARS-CoV-2 viral copies  this assay can detect is 138 copies/mL. A negative result does not preclude SARS-Cov-2 infection and should not be used as the sole basis for treatment or other patient management decisions. A negative result may occur with  improper specimen collection/handling, submission of specimen other than nasopharyngeal swab, presence of viral mutation(s) within the areas targeted by this assay, and inadequate number of viral copies(<138 copies/mL). A negative result must be combined with clinical observations, patient history, and epidemiological information. The expected result is Negative.  Fact Sheet for Patients:  EntrepreneurPulse.com.au  Fact Sheet for Healthcare Providers:  IncredibleEmployment.be  This test is no t yet approved or cleared by the [place] FDA and  has been authorized for detection and/or diagnosis of  SARS-CoV-2 by FDA under an Emergency Use Authorization (EUA). This EUA will remain  in effect (meaning this test can be used) for the duration of the COVID-19 declaration under Section 564(b)(1) of the Act, 21 U.S.C.section 360bbb-3(b)(1), unless the authorization is terminated  or revoked sooner.       Influenza A by PCR NEGATIVE NEGATIVE Final   Influenza B by PCR NEGATIVE NEGATIVE Final    Comment: (NOTE) The Xpert Xpress SARS-CoV-2/FLU/RSV plus assay is intended as an aid in the diagnosis of influenza from Nasopharyngeal swab specimens and should not be used as a sole basis for treatment. Nasal washings and aspirates are unacceptable for Xpert Xpress SARS-CoV-2/FLU/RSV testing.  Fact Sheet for Patients: EntrepreneurPulse.com.au  Fact Sheet for Healthcare Providers: IncredibleEmployment.be  This test is not yet approved or cleared by the [place] FDA and has been authorized for detection and/or diagnosis of SARS-CoV-2 by FDA under an Emergency Use Authorization (EUA). This EUA will remain in effect (meaning this test can be used) for the duration of the COVID-19 declaration under Section 564(b)(1) of the Act, 21 U.S.C. section 360bbb-3(b)(1), unless the authorization is terminated or revoked.     Resp Syncytial Virus by PCR NEGATIVE NEGATIVE Final    Comment: (NOTE) Fact Sheet for Patients: EntrepreneurPulse.com.au  Fact Sheet for Healthcare Providers: IncredibleEmployment.be  This test is not yet approved or cleared by the [place] FDA and has been authorized for detection and/or diagnosis of SARS-CoV-2 by FDA under an Emergency Use Authorization (EUA). This EUA will remain in effect (meaning this test can be used) for the duration of the COVID-19 declaration under Section 564(b)(1) of the Act, 21 U.S.C. section 360bbb-3(b)(1), unless the authorization is terminated  or revoked.  Performed at [facility], [address] [place], [place] [address]   Urine Culture     Status: Abnormal   Collection Time: 12/02/22  3:08 AM   Specimen: Urine, Clean Catch  Result Value Ref Range Status   Specimen Description   Final    URINE, CLEAN CATCH Performed at [facility], [address] [place], [place] [address]    Special Requests   Final    NONE Performed at [facility], [address] [place], [place] [address]    Culture MULTIPLE SPECIES PRESENT, SUGGEST RECOLLECTION (A)  Final   Report Status December 11, 2022 FINAL  Final         Radiology Studies: EEG adult  Result Date: 12-11-2022 Lora Havens, MD     12-11-22  5:02 PM Patient Name: OCIA SIMEK MRN: [id number] Epilepsy Attending: Lora Havens Referring Physician/Provider: Vernelle Emerald, MD Date: December 11, 2022 Duration: 24.32 mins Patient history: 76yo F with syncope. EEG to evaluate for seizure. Level of alertness:  lethargic,  asleep AEDs during EEG study: Ativan Technical aspects: This EEG study was done with scalp electrodes positioned according to the 10-20 International system of electrode placement. Electrical activity was reviewed with band pass filter of 1-'70Hz'$ , sensitivity of 7 uV/mm, display speed of 6m/sec with a '60Hz'$  notched filter applied as appropriate. EEG data were recorded continuously and digitally stored.  Video monitoring was available and reviewed as appropriate. Description: No clear posterior dominant rhythm was seen. Sleep was characterized by sleep spindles (12-'14hz'$ ) maximal frontocentral region. EEG showed continuous generalized 3 to 6 Hz theta-delta slowing admixed with 13-'15Hz'$  generalized beta activity. Hyperventilation and photic stimulation were not performed.   ABNORMALITY - Continuous slow, generalized - Excessive beta, generalized IMPRESSION: This study is suggestive of moderate to severe diffuse encephalopathy, nonspecific  etiology. The excessive beta activity seen in the background is most likely due to the effect of benzodiazepine and is a benign EEG pattern. No seizures or epileptiform discharges were seen throughout the recording. Priyanka OBarbra Sarks       Scheduled Meds:  sodium chloride   Intravenous Once   enoxaparin (LOVENOX) injection  40 mg Subcutaneous QB35H  folic acid  1 mg Oral Daily   LORazepam  0-4 mg Intravenous Q12H   multivitamin with minerals  1 tablet Oral Daily   nystatin cream   Topical BID   sodium chloride flush  3 mL Intravenous Q12H   thiamine  100 mg Oral Daily   Or   thiamine  100 mg Intravenous Daily   Continuous Infusions:  sodium chloride 125 mL/hr at 12/05/22 1004   cefTRIAXone (ROCEPHIN)  IV Stopped (12/04/22 1543)     LOS: 3 days    Time spent: 51 minutes spent on chart review, discussion with nursing staff, consultants, updating family and interview/physical exam; more than 50% of that time was spent in counseling and/or coordination of care.    Witten Certain J [place] DO [facility] Available via Epic secure chat 7am-7pm After these hours, please refer to coverage provider listed on [email or web address] 12/05/2022, 10:36 AM

## 2022-12-05 NOTE — Care Management Important Message (Signed)
Important Message  Patient Details  Name: Sheri Butler MRN: [id number] Date of Birth: 13-Jan-1946   Medicare Important Message Given:  Yes     Dannette Barbara 12/05/2022, 11:30 AM

## 2022-12-05 NOTE — Progress Notes (Signed)
Physical Therapy Treatment Patient Details Name: Sheri Butler MRN: [id number] DOB: 13-Jun-1946 Today's Date: 12/05/2022   History of Present Illness Sheri Butler is a 76 y.o. female with medical history significant for Hypertension,  history of trans-anal rectal tubovillous adenoma resection at [facility] in 2019, alcohol use disorder, who presents with a syncopal episode x 2.  She did not fall or sustain any injury.  She was reportedly drinking scotch all day.  EMS was called out for the first episode but patient refused to come in but after the second episode an hour later she obliged.  BP with EMS was 114/66. ED course and data review: BP was 90/60 on arrival with otherwise normal vitals.  After several hours waiting in the ED BP dropped to 88/51 but was fluid responsive after 2 L to 104/74.   PT Comments    Patient has decreased level of alertness and minimal participation with therapy efforts today. Total assistance for rolling/repositioning in bed with minimal participation with therapeutic exercises/ROM. She does report generalized pain in LE with ROM/repositioning. At this time, SNF is recommended. PT will continue to follow and progress mobility as patient able to safely participate.    Recommendations for follow up therapy are one component of a multi-disciplinary discharge planning process, led by the attending physician.  Recommendations may be updated based on patient status, additional functional criteria and insurance authorization.  Follow Up Recommendations  Skilled nursing-short term rehab (<3 hours/day) Can patient physically be transported by private vehicle: No   Assistance Recommended at Discharge Frequent or constant Supervision/Assistance  Patient can return home with the following Two people to help with walking and/or transfers;Two people to help with bathing/dressing/bathroom;Help with stairs or ramp for entrance;Assist for transportation;Assistance with  cooking/housework;Direct supervision/assist for medications management;Direct supervision/assist for financial management;Assistance with feeding   Equipment Recommendations   (to be determined at next level of care)    Recommendations for Other Services       Precautions / Restrictions Precautions Precautions: Fall Restrictions Weight Bearing Restrictions: No     Mobility  Bed Mobility Overal bed mobility: Needs Assistance Bed Mobility: Rolling Rolling: Total assist         General bed mobility comments: total assistance for repositioning toward the right side. grimicing with any movement of LE. unsafe to attempt further mobility due to level of arousal    Transfers                        Ambulation/Gait                   Stairs             Wheelchair Mobility    Modified Rankin (Stroke Patients Only)       Balance                                            Cognition Arousal/Alertness: Lethargic Behavior During Therapy: Flat affect Overall Cognitive Status: Difficult to assess                                 General Comments: minimal participation. patient is mostly non-verbal with only taking occasionally        Exercises General Exercises - Lower Extremity Ankle Circles/Pumps: PROM, AAROM, Strengthening,  Both, 5 reps, Supine Heel Slides: PROM, AAROM, Strengthening, Both, 5 reps, Supine Hip ABduction/ADduction: PROM, AAROM, Strengthening, Both, 5 reps, Supine Other Exercises Other Exercises: encouraged active participation with LE exercises for strengthening. minimal participation. grimicnig with LE movement    General Comments        Pertinent Vitals/Pain Pain Assessment Pain Assessment: Faces Faces Pain Scale: Hurts a little bit Pain Location: generalized pain with ROM of BLE Pain Descriptors / Indicators: Grimacing Pain Intervention(s): Limited activity within patient's tolerance,  Monitored during session, Repositioned    Home Living Family/patient expects to be discharged to:: Private residence Living Arrangements: Spouse/significant other;Other relatives Available Help at Discharge: Available PRN/intermittently Type of Home: House Home Access: Stairs to enter   Entrance Stairs-Number of Steps: 2   Home Layout: One level Home Equipment: Conservation officer, nature (2 wheels) Additional Comments: Pt is unable to provide any information re: household setup or PLOF. All info provided by pt's oldest son, who is present throughout session.    Prior Function            PT Goals (current goals can now be found in the care plan section) Acute Rehab PT Goals Patient Stated Goal: patient unable to participate with goal setting PT Goal Formulation: Patient unable to participate in goal setting Time For Goal Achievement: 12/18/22 Potential to Achieve Goals: Fair Progress towards PT goals: Not progressing toward goals - comment    Frequency    Min 2X/week      PT Plan Discharge plan needs to be updated    Co-evaluation              AM-PAC PT "6 Clicks" Mobility   Outcome Measure  Help needed turning from your back to your side while in a flat bed without using bedrails?: Total Help needed moving from lying on your back to sitting on the side of a flat bed without using bedrails?: Total Help needed moving to and from a bed to a chair (including a wheelchair)?: Total Help needed standing up from a chair using your arms (e.g., wheelchair or bedside chair)?: Total Help needed to walk in hospital room?: Total Help needed climbing 3-5 steps with a railing? : Total 6 Click Score: 6    End of Session   Activity Tolerance: Patient limited by lethargy Patient left: in bed;with call bell/phone within reach;with bed alarm set;with family/visitor present (daughter present)   PT Visit Diagnosis: Unsteadiness on feet (R26.81);Muscle weakness (generalized) (M62.81);History  of falling (Z91.81);Difficulty in walking, not elsewhere classified (R26.2);Pain     Time: 6803-2122 PT Time Calculation (min) (ACUTE ONLY): 14 min  Charges:  $Therapeutic Exercise: 8-22 mins                    Minna Merritts, PT, MPT    Percell Locus 12/05/2022, 1:28 PM

## 2022-12-05 NOTE — TOC Progression Note (Signed)
Transition of Care [facility]) - Progression Note    Patient Details  Name: Sheri Butler MRN: [id number] Date of Birth: 1946-04-02  [facility] CM/SW Contact  Gerilyn Pilgrim, LCSW Phone Number: 12/05/2022, 2:20 PM  Clinical Narrative:   SW spoke with daughter regarding SNF placement. Daughter reports that she is agreeable to a referral to be sent out to all the facilities in [place] and will share preference upon acceptance. SW will start SNF workup for patient.      Expected Discharge Plan and Services                                               Social Determinants of Health (SDOH) Interventions SDOH Screenings   Food Insecurity: No Food Insecurity (12/03/2022)  Housing: Low Risk  (12/03/2022)  Transportation Needs: No Transportation Needs (12/03/2022)  Utilities: Not At Risk (12/03/2022)  Tobacco Use: Low Risk  (12/03/2022)    Readmission Risk Interventions     No data to display

## 2022-12-06 DIAGNOSIS — R55 Syncope and collapse: Secondary | ICD-10-CM | POA: Diagnosis not present

## 2022-12-06 LAB — D-DIMER, QUANTITATIVE: D-Dimer, Quant: 2.93 ug/mL-FEU — ABNORMAL HIGH (ref 0.00–0.50)

## 2022-12-06 NOTE — Progress Notes (Signed)
PROGRESS NOTE    Sheri Butler  QMV:[id number] DOB: 08/12/1946 DOA: 12/02/2022 PCP: Denton Lank, MD    Brief Narrative:   Sheri Butler is a 76 y.o. 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  Witnessed by family.  Workup in the ED was notable for hypotension, tachycardia.  Chest x-ray and CT head with no acute findings.  TRH was consulted for admission for further evaluation and management of syncope and hypotension.  Assessment & Plan:   Acute metabolic encephalopathy Syncope Patient presenting to ED with syncopal episodes x 2 at home, confusion.  Was found to be hypotensive in the ED with associated tachycardia.  CT head and C-spine without contrast with no acute findings.  EEG with no epileptiform or seizure-like activity noted.  B12 within lower limits.  Afebrile without leukocytosis. -- Supportive care  Right breast cellulitis/intertrigo -- Ceftriaxone 1 g IV every 24 hours -- Nystatin cream twice daily  Sick euthyroid syndrome TSH elevated 4.540, free T4 normal at 1.03. -- Recommend repeat TFTs outpatient  Folic acid deficiency Folate low at 3.7. -- Folic acid 1 mg p.o. daily  Alcohol use disorder Son reports heavy use of hard liquor on a daily basis.  No reported history of alcohol withdrawal seizures or significant withdrawal symptoms in the past. -- CIWA protocol with symptom triggered Ativan, cautious use of Ativan given her encephalopathy. -- Discussed needs complete alcohol cessation  Essential hypertension -- Continue to hold home antihypertensives -- Monitor BP closely  Acute on chronic anemia No evidence of bleeding clinically, transfuse 1 unit PRBC on admission.  Suspect hemodilution superimposed on myelosuppression from chronic heavy alcohol use. -- Hgb 7.1>7.0>>9.3>8.4>8.2 --  Continue intermittent monitoring of hemoglobin -- transfuse for hemoglobin less than 7.0 rectal bleeding  Metabolic acidosis: Resolved Serum bicarbonate 17 admission, likely secondary to dehydration.  Now resolved. -- Supportive care  Weakness/debility/deconditioning Adult failure to thrive --NS at 125 mL/h --PT/OT evaluation: Recommend SNF placement, social work for placement   DVT prophylaxis: enoxaparin (LOVENOX) injection 40 mg Start: 12/04/22 1700    Code Status: Full Code Family Communication: Updated patient's son present at bedside this morning  Disposition Plan:  Level of care: Progressive Status is: Inpatient Remains inpatient appropriate because: Pending SNF bed offer and insurance authorization, medically stable for discharge once bed available    Consultants:  None  Procedures:  EEG  Antimicrobials:  Ceftriaxone 12/25>>   Subjective: Patient seen examined bedside, much more alert this morning.  Patient states did eat breakfast this morning but son states did not.  Son asking about possible feeding tube, discussed with him that there is no current indication as she does not have a functional issue that would require placement of/device and discussed many complications leading to feeding tube placement.  Seems to have a better understanding.  Awaiting SNF placement.  Patient with no other complaints or concerns at this time.  Denies headache, no chest pain, no shortness of breath, no abdominal pain.  No acute concerns overnight per nursing staff.  Objective: Vitals:   12/06/22 0031 12/06/22 0427 12/06/22 0840 12/06/22 1104  BP: 94/68 111/68 118/79 114/66  Pulse: 94 87 85 100  Resp: '18 16 16 20  '$ Temp: 99.7 F (37.6 C) 99.4 F (37.4 C) 98.2 F (36.8 C) 99.1 F (37.3 C)  TempSrc:  Oral  Oral  SpO2:  100% 100% 100% 98%  Weight:      Height:        Intake/Output Summary (Last 24 hours) at 12/06/2022 1245 Last data filed at 12/05/2022 1935 Gross per 24 hour   Intake 0 ml  Output 1175 ml  Net -1175 ml   Filed Weights   12/03/22 2038 12/04/22 0500  Weight: 72.8 kg 73.1 kg    Examination:  Physical Exam: GEN: NAD, alert, chronically ill appearance, appears older than stated age, alert and oriented to place/time/person.   HEENT: NCAT, PERRL, EOMI, sclera clear, MMM PULM: CTAB w/o wheezes/crackles, normal respiratory effort, on room air with SpO2 99% at rest CV: RRR w/o M/G/R GI: abd soft, NTND, NABS, no R/G/M MSK: no peripheral edema, moves all extremities independently NEURO: CN II-XII intact, no focal deficits, sensation to light touch intact PSYCH: Depressed mood, flat affect Integumentary: Redness noted in bilateral breast folds, otherwise no other concerning rashes/lesions/wounds noted on exposed skin surfaces.      Data Reviewed: I have personally reviewed following labs and imaging studies  CBC: Recent Labs  Lab 12/02/22 0530 12/02/22 1030 12/03/22 0020 12/03/22 0853 12/04/22 0430 12/05/22 0437  WBC 5.5  --  4.7 5.0 5.9 6.2  NEUTROABS  --   --  3.5 3.7 4.5  --   HGB 7.1* 7.0* 8.3* 9.3* 8.4* 8.2*  HCT 22.8* 22.5* 26.2* 29.3* 25.7* 25.8*  MCV 89.8  --  88.8 87.5 85.4 86.6  PLT 148*  --  122* 140* 163 629   Basic Metabolic Panel: Recent Labs  Lab 12/01/22 1509 12/02/22 0530 12/03/22 0020 12/04/22 0430  NA 134* 139 137 137  K 3.9 3.5 3.1* 3.8  CL 95* 105 105 108  CO2 17* '22 23 22  '$ GLUCOSE 98 151* 89 81  BUN '18 18 13 8  '$ CREATININE 1.10* 1.07* 0.95 0.69  CALCIUM 9.3 7.9* 8.0* 8.3*  MG  --   --  1.3* 1.7   GFR: Estimated Creatinine Clearance: 48.7 mL/min (by C-G formula based on SCr of 0.69 mg/dL). Liver Function Tests: Recent Labs  Lab 12/02/22 0121 12/03/22 0020 12/04/22 0430  AST '27 25 24  '$ ALT '9 9 9  '$ ALKPHOS 38 38 44  BILITOT 1.7* 2.9* 1.0  PROT 6.6 6.0* 5.7*  ALBUMIN 2.7* 2.5* 2.4*   No results for input(s): "LIPASE", "AMYLASE" in the last 168 hours. Recent Labs  Lab 12/03/22 1612  AMMONIA  <10   Coagulation Profile: Recent Labs  Lab 12/03/22 0020  INR 1.1   Cardiac Enzymes: No results for input(s): "CKTOTAL", "CKMB", "CKMBINDEX", "TROPONINI" in the last 168 hours. BNP (last 3 results) No results for input(s): "PROBNP" in the last 8760 hours. HbA1C: No results for input(s): "HGBA1C" in the last 72 hours. CBG: Recent Labs  Lab 12/04/22 0816  GLUCAP 90   Lipid Profile: No results for input(s): "CHOL", "HDL", "LDLCALC", "TRIG", "CHOLHDL", "LDLDIRECT" in the last 72 hours. Thyroid Function Tests: Recent Labs    12/04/22 0428  FREET4 1.03   Anemia Panel: No results for input(s): "VITAMINB12", "FOLATE", "FERRITIN", "TIBC", "IRON", "RETICCTPCT" in the last 72 hours.  Sepsis Labs: Recent Labs  Lab 12/02/22 0121 12/02/22 0244 12/02/22 0530  PROCALCITON 0.22  --   --   LATICACIDVEN  --  1.2 1.2    Recent Results (from the past 240 hour(s))  Resp panel by RT-PCR (RSV, Flu A&B, Covid) Anterior Nasal Swab     Status: None   Collection Time: 12/02/22  1:21 AM  Specimen: Anterior Nasal Swab  Result Value Ref Range Status   SARS Coronavirus 2 by RT PCR NEGATIVE NEGATIVE Final    Comment: (NOTE) SARS-CoV-2 target nucleic acids are NOT DETECTED.  The SARS-CoV-2 RNA is generally detectable in upper respiratory specimens during the acute phase of infection. The lowest concentration of SARS-CoV-2 viral copies this assay can detect is 138 copies/mL. A negative result does not preclude SARS-Cov-2 infection and should not be used as the sole basis for treatment or other patient management decisions. A negative result may occur with  improper specimen collection/handling, submission of specimen other than nasopharyngeal swab, presence of viral mutation(s) within the areas targeted by this assay, and inadequate number of viral copies(<138 copies/mL). A negative result must be combined with clinical observations, patient history, and epidemiological information. The  expected result is Negative.  Fact Sheet for Patients:  EntrepreneurPulse.com.au  Fact Sheet for Healthcare Providers:  IncredibleEmployment.be  This test is no t yet approved or cleared by the [place] FDA and  has been authorized for detection and/or diagnosis of SARS-CoV-2 by FDA under an Emergency Use Authorization (EUA). This EUA will remain  in effect (meaning this test can be used) for the duration of the COVID-19 declaration under Section 564(b)(1) of the Act, 21 U.S.C.section 360bbb-3(b)(1), unless the authorization is terminated  or revoked sooner.       Influenza A by PCR NEGATIVE NEGATIVE Final   Influenza B by PCR NEGATIVE NEGATIVE Final    Comment: (NOTE) The Xpert Xpress SARS-CoV-2/FLU/RSV plus assay is intended as an aid in the diagnosis of influenza from Nasopharyngeal swab specimens and should not be used as a sole basis for treatment. Nasal washings and aspirates are unacceptable for Xpert Xpress SARS-CoV-2/FLU/RSV testing.  Fact Sheet for Patients: EntrepreneurPulse.com.au  Fact Sheet for Healthcare Providers: IncredibleEmployment.be  This test is not yet approved or cleared by the [place] FDA and has been authorized for detection and/or diagnosis of SARS-CoV-2 by FDA under an Emergency Use Authorization (EUA). This EUA will remain in effect (meaning this test can be used) for the duration of the COVID-19 declaration under Section 564(b)(1) of the Act, 21 U.S.C. section 360bbb-3(b)(1), unless the authorization is terminated or revoked.     Resp Syncytial Virus by PCR NEGATIVE NEGATIVE Final    Comment: (NOTE) Fact Sheet for Patients: EntrepreneurPulse.com.au  Fact Sheet for Healthcare Providers: IncredibleEmployment.be  This test is not yet approved or cleared by the [place] FDA and has been authorized for detection and/or  diagnosis of SARS-CoV-2 by FDA under an Emergency Use Authorization (EUA). This EUA will remain in effect (meaning this test can be used) for the duration of the COVID-19 declaration under Section 564(b)(1) of the Act, 21 U.S.C. section 360bbb-3(b)(1), unless the authorization is terminated or revoked.  Performed at [facility], [address] [place], [place] [address]   Urine Culture     Status: Abnormal   Collection Time: 12/02/22  3:08 AM   Specimen: Urine, Clean Catch  Result Value Ref Range Status   Specimen Description   Final    URINE, CLEAN CATCH Performed at [facility], [address], [place], [place] [address]    Special Requests   Final    NONE Performed at [facility], [address] [place], [place] [address]    Culture MULTIPLE SPECIES PRESENT, SUGGEST RECOLLECTION (A)  Final   Report Status 12/03/2022 FINAL  Final         Radiology Studies:  No results found.      Scheduled Meds:  sodium chloride   Intravenous Once   enoxaparin (LOVENOX) injection  40 mg Subcutaneous J68T   folic acid  1 mg Oral Daily   multivitamin with minerals  1 tablet Oral Daily   nystatin cream   Topical BID   sodium chloride flush  3 mL Intravenous Q12H   thiamine  100 mg Oral Daily   Or   thiamine  100 mg Intravenous Daily   Continuous Infusions:  sodium chloride 125 mL/hr at 12/06/22 0134   cefTRIAXone (ROCEPHIN)  IV 1 g (12/05/22 1530)     LOS: 4 days    Time spent: 51 minutes spent on chart review, discussion with nursing staff, consultants, updating family and interview/physical exam; more than 50% of that time was spent in counseling and/or coordination of care.    Shadai Mcclane J [place] DO [facility] Available via Epic secure chat 7am-7pm After these hours, please refer to coverage provider listed on [email or web address] 12/06/2022, 12:45 PM

## 2022-12-07 ENCOUNTER — Inpatient Hospital Stay: Payer: Medicare Other

## 2022-12-07 DIAGNOSIS — R55 Syncope and collapse: Secondary | ICD-10-CM | POA: Diagnosis not present

## 2022-12-07 LAB — GLUCOSE, CAPILLARY
Glucose-Capillary: 78 mg/dL (ref 70–99)
Glucose-Capillary: 76 mg/dL (ref 70–99)
Glucose-Capillary: 57 mg/dL — ABNORMAL LOW (ref 70–99)
Glucose-Capillary: 69 mg/dL — ABNORMAL LOW (ref 70–99)
Glucose-Capillary: 88 mg/dL (ref 70–99)

## 2022-12-07 NOTE — Progress Notes (Signed)
PROGRESS NOTE    Sheri Butler  ONG:[id number] DOB: Oct 11, 1946 DOA: 12/02/2022 PCP: Denton Lank, MD    Brief Narrative:   Sheri Butler is a 76 y.o. 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  Witnessed by family.  Workup in the ED was notable for hypotension, tachycardia.  Chest x-ray and CT head with no acute findings.  TRH was consulted for admission for further evaluation and management of syncope and hypotension.  Assessment & Plan:   Acute metabolic encephalopathy Syncope Patient presenting to ED with syncopal episodes x 2 at home, confusion.  Was found to be hypotensive in the ED with associated tachycardia.  CT head and C-spine without contrast with no acute findings.  EEG with no epileptiform or seizure-like activity noted.  B12 within lower limits.  Afebrile without leukocytosis. -- Supportive care  Right breast cellulitis/intertrigo -- Ceftriaxone 1 g IV every 24 hours -- Nystatin cream twice daily  Left upper extremity edema Etiology likely secondary to infiltrated IV site.  D-dimer although elevated. -- Check vascular duplex ultrasound left upper extremity  Sick euthyroid syndrome TSH elevated 4.540, free T4 normal at 1.03. -- Recommend repeat TFTs outpatient  Folic acid deficiency Folate low at 3.7. -- Folic acid 1 mg p.o. daily  Alcohol use disorder Son reports heavy use of hard liquor on a daily basis.  No reported history of alcohol withdrawal seizures or significant withdrawal symptoms in the past. -- CIWA protocol with symptom triggered Ativan, cautious use of Ativan given her encephalopathy. -- Discussed needs complete alcohol cessation  Essential hypertension -- Continue to hold home antihypertensives -- Monitor BP closely  Acute on chronic anemia No evidence of bleeding  clinically, transfuse 1 unit PRBC on admission.  Suspect hemodilution superimposed on myelosuppression from chronic heavy alcohol use. -- Hgb 7.1>7.0>>9.3>8.4>8.2 -- Continue intermittent monitoring of hemoglobin -- transfuse for hemoglobin less than 7.0 rectal bleeding  Metabolic acidosis: Resolved Serum bicarbonate 17 admission, likely secondary to dehydration.  Now resolved. -- Supportive care  Weakness/debility/deconditioning Adult failure to thrive --NS at 125 mL/h --PT/OT evaluation: Recommend SNF placement, social work for placement   DVT prophylaxis: enoxaparin (LOVENOX) injection 40 mg Start: 12/04/22 1700    Code Status: Full Code Family Communication: No family present at bedside this morning  Disposition Plan:  Level of care: Progressive Status is: Inpatient Remains inpatient appropriate because: Pending SNF bed offer and insurance authorization, medically stable for discharge once bed available    Consultants:  None  Procedures:  EEG  Antimicrobials:  Ceftriaxone 12/25>>   Subjective: Patient seen examined bedside, lying in bed.  States had breakfast this morning.  No specific complaints.  Patient with mild swelling of left upper extremity likely secondary to IV infiltration but with elevated D-dimer we will check vascular duplex ultrasound left upper extremity.  No family present this morning. Awaiting SNF placement.  Patient with no other complaints or concerns at this time.  Denies headache, no chest pain, no shortness of breath, no abdominal pain.  No acute concerns overnight per nursing staff.  Objective: Vitals:   12/07/22 0308 12/07/22 0823 12/07/22 0955 12/07/22 1227  BP: 121/68 121/72  (!) 121/93  Pulse: 92 82  94  Resp:  18  20  Temp: 99.4 F (37.4 C) 98.6 F (37 C)  98.2 F (36.8 C)  TempSrc: Oral  SpO2: 96% 99%  100%  Weight:   72.2 kg   Height:        Intake/Output Summary (Last 24 hours) at 12/07/2022 1254 Last data filed at  12/06/2022 2595 Gross per 24 hour  Intake 1300 ml  Output 200 ml  Net 1100 ml   Filed Weights   12/03/22 2038 12/04/22 0500 12/07/22 0955  Weight: 72.8 kg 73.1 kg 72.2 kg    Examination:  Physical Exam: GEN: NAD, alert, chronically ill appearance, appears older than stated age, alert and oriented to place/time/person.   HEENT: NCAT, PERRL, EOMI, sclera clear, MMM PULM: CTAB w/o wheezes/crackles, normal respiratory effort, on room air with SpO2 99% at rest CV: RRR w/o M/G/R GI: abd soft, NTND, NABS, no R/G/M MSK: + LUE edema, moves all extremities independently NEURO: CN II-XII intact, no focal deficits, sensation to light touch intact PSYCH: Depressed mood, flat affect Integumentary: Redness noted in bilateral breast folds, otherwise no other concerning rashes/lesions/wounds noted on exposed skin surfaces.      Data Reviewed: I have personally reviewed following labs and imaging studies  CBC: Recent Labs  Lab 12/02/22 0530 12/02/22 1030 12/03/22 0020 12/03/22 0853 12/04/22 0430 12/05/22 0437  WBC 5.5  --  4.7 5.0 5.9 6.2  NEUTROABS  --   --  3.5 3.7 4.5  --   HGB 7.1* 7.0* 8.3* 9.3* 8.4* 8.2*  HCT 22.8* 22.5* 26.2* 29.3* 25.7* 25.8*  MCV 89.8  --  88.8 87.5 85.4 86.6  PLT 148*  --  122* 140* 163 638   Basic Metabolic Panel: Recent Labs  Lab 12/01/22 1509 12/02/22 0530 12/03/22 0020 12/04/22 0430  NA 134* 139 137 137  K 3.9 3.5 3.1* 3.8  CL 95* 105 105 108  CO2 17* '22 23 22  '$ GLUCOSE 98 151* 89 81  BUN '18 18 13 8  '$ CREATININE 1.10* 1.07* 0.95 0.69  CALCIUM 9.3 7.9* 8.0* 8.3*  MG  --   --  1.3* 1.7   GFR: Estimated Creatinine Clearance: 48.3 mL/min (by C-G formula based on SCr of 0.69 mg/dL). Liver Function Tests: Recent Labs  Lab 12/02/22 0121 12/03/22 0020 12/04/22 0430  AST '27 25 24  '$ ALT '9 9 9  '$ ALKPHOS 38 38 44  BILITOT 1.7* 2.9* 1.0  PROT 6.6 6.0* 5.7*  ALBUMIN 2.7* 2.5* 2.4*   No results for input(s): "LIPASE", "AMYLASE" in the last 168  hours. Recent Labs  Lab 12/03/22 1612  AMMONIA <10   Coagulation Profile: Recent Labs  Lab 12/03/22 0020  INR 1.1   Cardiac Enzymes: No results for input(s): "CKTOTAL", "CKMB", "CKMBINDEX", "TROPONINI" in the last 168 hours. BNP (last 3 results) No results for input(s): "PROBNP" in the last 8760 hours. HbA1C: No results for input(s): "HGBA1C" in the last 72 hours. CBG: Recent Labs  Lab 12/04/22 0816 12/07/22 0311 12/07/22 0447  GLUCAP 90 78 76   Lipid Profile: No results for input(s): "CHOL", "HDL", "LDLCALC", "TRIG", "CHOLHDL", "LDLDIRECT" in the last 72 hours. Thyroid Function Tests: No results for input(s): "TSH", "T4TOTAL", "FREET4", "T3FREE", "THYROIDAB" in the last 72 hours.  Anemia Panel: No results for input(s): "VITAMINB12", "FOLATE", "FERRITIN", "TIBC", "IRON", "RETICCTPCT" in the last 72 hours.  Sepsis Labs: Recent Labs  Lab 12/02/22 0121 12/02/22 0244 12/02/22 0530  PROCALCITON 0.22  --   --   LATICACIDVEN  --  1.2 1.2    Recent Results (from the past 240 hour(s))  Resp panel by RT-PCR (RSV, Flu A&B, Covid) Anterior Nasal Swab  Status: None   Collection Time: 12/02/22  1:21 AM   Specimen: Anterior Nasal Swab  Result Value Ref Range Status   SARS Coronavirus 2 by RT PCR NEGATIVE NEGATIVE Final    Comment: (NOTE) SARS-CoV-2 target nucleic acids are NOT DETECTED.  The SARS-CoV-2 RNA is generally detectable in upper respiratory specimens during the acute phase of infection. The lowest concentration of SARS-CoV-2 viral copies this assay can detect is 138 copies/mL. A negative result does not preclude SARS-Cov-2 infection and should not be used as the sole basis for treatment or other patient management decisions. A negative result may occur with  improper specimen collection/handling, submission of specimen other than nasopharyngeal swab, presence of viral mutation(s) within the areas targeted by this assay, and inadequate number of  viral copies(<138 copies/mL). A negative result must be combined with clinical observations, patient history, and epidemiological information. The expected result is Negative.  Fact Sheet for Patients:  EntrepreneurPulse.com.au  Fact Sheet for Healthcare Providers:  IncredibleEmployment.be  This test is no t yet approved or cleared by the [place] FDA and  has been authorized for detection and/or diagnosis of SARS-CoV-2 by FDA under an Emergency Use Authorization (EUA). This EUA will remain  in effect (meaning this test can be used) for the duration of the COVID-19 declaration under Section 564(b)(1) of the Act, 21 U.S.C.section 360bbb-3(b)(1), unless the authorization is terminated  or revoked sooner.       Influenza A by PCR NEGATIVE NEGATIVE Final   Influenza B by PCR NEGATIVE NEGATIVE Final    Comment: (NOTE) The Xpert Xpress SARS-CoV-2/FLU/RSV plus assay is intended as an aid in the diagnosis of influenza from Nasopharyngeal swab specimens and should not be used as a sole basis for treatment. Nasal washings and aspirates are unacceptable for Xpert Xpress SARS-CoV-2/FLU/RSV testing.  Fact Sheet for Patients: EntrepreneurPulse.com.au  Fact Sheet for Healthcare Providers: IncredibleEmployment.be  This test is not yet approved or cleared by the [place] FDA and has been authorized for detection and/or diagnosis of SARS-CoV-2 by FDA under an Emergency Use Authorization (EUA). This EUA will remain in effect (meaning this test can be used) for the duration of the COVID-19 declaration under Section 564(b)(1) of the Act, 21 U.S.C. section 360bbb-3(b)(1), unless the authorization is terminated or revoked.     Resp Syncytial Virus by PCR NEGATIVE NEGATIVE Final    Comment: (NOTE) Fact Sheet for Patients: EntrepreneurPulse.com.au  Fact Sheet for Healthcare  Providers: IncredibleEmployment.be  This test is not yet approved or cleared by the [place] FDA and has been authorized for detection and/or diagnosis of SARS-CoV-2 by FDA under an Emergency Use Authorization (EUA). This EUA will remain in effect (meaning this test can be used) for the duration of the COVID-19 declaration under Section 564(b)(1) of the Act, 21 U.S.C. section 360bbb-3(b)(1), unless the authorization is terminated or revoked.  Performed at [facility], [address] [place], [place] [address]   Urine Culture     Status: Abnormal   Collection Time: 12/02/22  3:08 AM   Specimen: Urine, Clean Catch  Result Value Ref Range Status   Specimen Description   Final    URINE, CLEAN CATCH Performed at [facility], [address], [place], [place] [address]    Special Requests   Final    NONE Performed at [facility], [address] [place], [place] [address]    Culture MULTIPLE SPECIES PRESENT, SUGGEST RECOLLECTION (A)  Final   Report Status 12/03/2022 FINAL  Final         Radiology Studies: No results found.      Scheduled Meds:  sodium chloride   Intravenous Once   enoxaparin (LOVENOX) injection  40 mg Subcutaneous J85U   folic acid  1 mg Oral Daily   multivitamin with minerals  1 tablet Oral Daily   nystatin cream   Topical BID   sodium chloride flush  3 mL Intravenous Q12H   thiamine  100 mg Oral Daily   Or   thiamine  100 mg Intravenous Daily   Continuous Infusions:  sodium chloride 125 mL/hr at 12/06/22 0134   cefTRIAXone (ROCEPHIN)  IV 1 g (12/05/22 1530)     LOS: 5 days    Time spent: 51 minutes spent on chart review, discussion with nursing staff, consultants, updating family and interview/physical exam; more than 50% of that time was spent in counseling and/or coordination of care.    Zebediah Beezley J [place] DO [facility] Available via Epic secure chat 7am-7pm After these  hours, please refer to coverage provider listed on [email or web address] 12/07/2022, 12:54 PM

## 2022-12-08 DIAGNOSIS — R55 Syncope and collapse: Secondary | ICD-10-CM | POA: Diagnosis not present

## 2022-12-08 LAB — GLUCOSE, CAPILLARY
Glucose-Capillary: 102 mg/dL — ABNORMAL HIGH (ref 70–99)
Glucose-Capillary: 117 mg/dL — ABNORMAL HIGH (ref 70–99)

## 2022-12-08 MED ORDER — VITAMIN B-1 100 MG PO TABS: 100.0000 mg | ORAL_TABLET | Freq: Every day | ORAL | Status: AC

## 2022-12-08 MED ORDER — DEXTROSE-NACL 5-0.9 % IV SOLN: INTRAVENOUS | Status: DC

## 2022-12-08 MED ORDER — ADULT MULTIVITAMIN W/MINERALS CH: 1.0000 | ORAL_TABLET | Freq: Every day | ORAL | Status: AC

## 2022-12-08 MED ORDER — CEPHALEXIN 500 MG PO CAPS: 500.0000 mg | ORAL_CAPSULE | Freq: Four times a day (QID) | ORAL | 0 refills | Status: AC

## 2022-12-08 MED ORDER — CALAMINE EX LOTN
1.0000 | TOPICAL_LOTION | CUTANEOUS | Status: DC | PRN
  Filled 2022-12-08: qty 177

## 2022-12-08 NOTE — TOC Transition Note (Signed)
Transition of Care [facility]) - CM/SW Discharge Note   Patient Details  Name: Sheri Butler MRN: [id number] Date of Birth: [birth year]  Transition of Care [facility]) CM/SW Contact:  Tiburcio Bash, LCSW Phone Number: 12/08/2022, 2:41 PM   Clinical Narrative:     Patient will DC to: [place] date: 12/08/22 Family notified:daughter Tiffany Transport XL:[id number]  Per MD patient ready for DC to [facility] . RN, patient, patient's family, and facility notified of DC. Discharge Summary sent to facility. RN given number for report [phone] Room 62A . DC packet on chart. Ambulance transport requested for patient.  CSW signing off.  Pricilla Riffle, LCSW    Final next level of care: Skilled Nursing Facility Barriers to Discharge: No Barriers Identified   Patient Goals and CMS Choice CMS Medicare.gov Compare Post Acute Care list provided to:: Patient Represenative (must comment) (family)    Discharge Placement                Patient chooses bed at: [facility] Patient to be transferred to facility by: [facility]   Patient and family notified of of transfer: 12/08/22  Discharge Plan and Services Additional resources added to the After Visit Summary for                                       Social Determinants of Health (SDOH) Interventions SDOH Screenings   Food Insecurity: No Food Insecurity (12/03/2022)  Housing: Low Risk  (12/03/2022)  Transportation Needs: No Transportation Needs (12/03/2022)  Utilities: Not At Risk (12/03/2022)  Tobacco Use: Low Risk  (12/03/2022)     Readmission Risk Interventions     No data to display

## 2022-12-08 NOTE — Care Management Important Message (Signed)
Important Message  Patient Details  Name: Sheri Butler MRN: [id number] Date of Birth: 18-Oct-1946   Medicare Important Message Given:  Other (see comment)  Attempted to reach patient via room phone to review Medicare IM.  Unable to reach at this time.    Dannette Barbara 12/08/2022, 3:24 PM

## 2022-12-08 NOTE — Progress Notes (Signed)
PROGRESS NOTE    Sheri Butler  FAO:[id number] DOB: July 10, 1946 DOA: 12/02/2022 PCP: Denton Lank, MD    Brief Narrative:   Sheri Butler is a 77 y.o. 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  Witnessed by family.  Workup in the ED was notable for hypotension, tachycardia.  Chest x-ray and CT head with no acute findings.  TRH was consulted for admission for further evaluation and management of syncope and hypotension.  Assessment & Plan:   Acute metabolic encephalopathy Syncope Patient presenting to ED with syncopal episodes x 2 at home, confusion.  Was found to be hypotensive in the ED with associated tachycardia.  CT head and C-spine without contrast with no acute findings.  EEG with no epileptiform or seizure-like activity noted.  B12 within lower limits.  Afebrile without leukocytosis. -- Supportive care  Hypoglycemia Per night glucose noted to be 57.  Likely secondary to poor oral intake. -- D5 NS at 75 mL/h -- Encourage increased oral intake -- CBGs every 4 hours  Right breast cellulitis/intertrigo -- Completed 5-day course of ceftriaxone -- Nystatin cream twice daily  Left upper extremity edema Etiology likely secondary to infiltrated IV site.  D-dimer elevated.  Vascular duplex ultrasound left upper spine negative for DVT.  Sick euthyroid syndrome TSH elevated 4.540, free T4 normal at 1.03. -- Recommend repeat TFTs outpatient  Folic acid deficiency Folate low at 3.7. -- Folic acid 1 mg p.o. daily  Alcohol use disorder Son reports heavy use of hard liquor on a daily basis.  No reported history of alcohol withdrawal seizures or significant withdrawal symptoms in the past. -- CIWA protocol with symptom triggered Ativan, cautious use of Ativan given her encephalopathy. -- Discussed needs complete  alcohol cessation  Essential hypertension -- Continue to hold home antihypertensives -- Monitor BP closely  Acute on chronic anemia No evidence of bleeding clinically, transfuse 1 unit PRBC on admission.  Suspect hemodilution superimposed on myelosuppression from chronic heavy alcohol use. -- Hgb 7.1>7.0>>9.3>8.4>8.2 -- Continue intermittent monitoring of hemoglobin -- transfuse for hemoglobin less than 7.0 rectal bleeding  Metabolic acidosis: Resolved Serum bicarbonate 17 admission, likely secondary to dehydration.  Now resolved. -- Supportive care  Weakness/debility/deconditioning Adult failure to thrive -- D5 NS at 75 mL/h -- PT/OT evaluation: Recommend SNF placement, social work for placement   DVT prophylaxis: enoxaparin (LOVENOX) injection 40 mg Start: 12/04/22 1700    Code Status: Full Code Family Communication: No family present at bedside this morning  Disposition Plan:  Level of care: Progressive Status is: Inpatient Remains inpatient appropriate because: Pending SNF bed offer and insurance authorization, medically stable for discharge once bed available    Consultants:  None  Procedures:  EEG  Antimicrobials:  Ceftriaxone 12/25>>   Subjective: Patient seen examined bedside, lying in bed.  Sleeping but arousable.  Poorly interactive.  RN reports poor oral intake.  States had breakfast this morning.  No specific complaints.  Vascular ultrasound negative for DVT left upper extremity.  No family present this morning. Awaiting SNF placement.  Patient with no other complaints or concerns at this time.  Denies headache, no chest pain, no shortness of breath, no abdominal pain.  RN reports hypoglycemia of 57 overnight.  Started on D5 NS infusion.  Otherwise, no acute concerns overnight per nursing staff.  Objective: Vitals:   12/08/22 0407 12/08/22 0700  12/08/22 0900 12/08/22 1258  BP: 106/60 (!) 185/161 114/61 117/62  Pulse: (!) 102 (!) 101 96 92  Resp: '17 17   17  '$ Temp: 99.1 F (37.3 C) 98 F (36.7 C)  98.3 F (36.8 C)  TempSrc: Axillary     SpO2: 94% 100%  98%  Weight:      Height:        Intake/Output Summary (Last 24 hours) at 12/08/2022 1302 Last data filed at 12/08/2022 0515 Gross per 24 hour  Intake --  Output 350 ml  Net -350 ml   Filed Weights   12/03/22 2038 12/04/22 0500 12/07/22 0955  Weight: 72.8 kg 73.1 kg 72.2 kg    Examination:  Physical Exam: GEN: NAD, alert, chronically ill appearance, appears older than stated age, alert and oriented to place/time/person.  Poorly interactive HEENT: NCAT, PERRL, EOMI, sclera clear, MMM PULM: CTAB w/o wheezes/crackles, normal respiratory effort, on room air with SpO2 99% at rest CV: RRR w/o M/G/R GI: abd soft, NTND, NABS, no R/G/M MSK: + LUE edema, moves all extremities independently NEURO: CN II-XII intact, no focal deficits, sensation to light touch intact PSYCH: Depressed mood, flat affect Integumentary: Redness noted in bilateral breast folds, otherwise no other concerning rashes/lesions/wounds noted on exposed skin surfaces.   Data Reviewed: I have personally reviewed following labs and imaging studies  CBC: Recent Labs  Lab 12/02/22 0530 12/02/22 1030 12/03/22 0020 12/03/22 0853 12/04/22 0430 12/05/22 0437  WBC 5.5  --  4.7 5.0 5.9 6.2  NEUTROABS  --   --  3.5 3.7 4.5  --   HGB 7.1* 7.0* 8.3* 9.3* 8.4* 8.2*  HCT 22.8* 22.5* 26.2* 29.3* 25.7* 25.8*  MCV 89.8  --  88.8 87.5 85.4 86.6  PLT 148*  --  122* 140* 163 962   Basic Metabolic Panel: Recent Labs  Lab 12/01/22 1509 12/02/22 0530 12/03/22 0020 12/04/22 0430  NA 134* 139 137 137  K 3.9 3.5 3.1* 3.8  CL 95* 105 105 108  CO2 17* '22 23 22  '$ GLUCOSE 98 151* 89 81  BUN '18 18 13 8  '$ CREATININE 1.10* 1.07* 0.95 0.69  CALCIUM 9.3 7.9* 8.0* 8.3*  MG  --   --  1.3* 1.7   GFR: Estimated Creatinine Clearance: 48.3 mL/min (by C-G formula based on SCr of 0.69 mg/dL). Liver Function Tests: Recent Labs  Lab  12/02/22 0121 12/03/22 0020 12/04/22 0430  AST '27 25 24  '$ ALT '9 9 9  '$ ALKPHOS 38 38 44  BILITOT 1.7* 2.9* 1.0  PROT 6.6 6.0* 5.7*  ALBUMIN 2.7* 2.5* 2.4*   No results for input(s): "LIPASE", "AMYLASE" in the last 168 hours. Recent Labs  Lab 12/03/22 1612  AMMONIA <10   Coagulation Profile: Recent Labs  Lab 12/03/22 0020  INR 1.1   Cardiac Enzymes: No results for input(s): "CKTOTAL", "CKMB", "CKMBINDEX", "TROPONINI" in the last 168 hours. BNP (last 3 results) No results for input(s): "PROBNP" in the last 8760 hours. HbA1C: No results for input(s): "HGBA1C" in the last 72 hours. CBG: Recent Labs  Lab 12/07/22 2119 12/07/22 2143 12/07/22 2236 12/08/22 0017 12/08/22 0403  GLUCAP 57* 69* 88 117* 102*   Lipid Profile: No results for input(s): "CHOL", "HDL", "LDLCALC", "TRIG", "CHOLHDL", "LDLDIRECT" in the last 72 hours. Thyroid Function Tests: No results for input(s): "TSH", "T4TOTAL", "FREET4", "T3FREE", "THYROIDAB" in the last 72 hours.  Anemia Panel: No results for input(s): "VITAMINB12", "FOLATE", "FERRITIN", "TIBC", "IRON", "RETICCTPCT" in the last 72 hours.  Sepsis  Labs: Recent Labs  Lab 12/02/22 0121 12/02/22 0244 12/02/22 0530  PROCALCITON 0.22  --   --   LATICACIDVEN  --  1.2 1.2    Recent Results (from the past 240 hour(s))  Resp panel by RT-PCR (RSV, Flu A&B, Covid) Anterior Nasal Swab     Status: None   Collection Time: 12/02/22  1:21 AM   Specimen: Anterior Nasal Swab  Result Value Ref Range Status   SARS Coronavirus 2 by RT PCR NEGATIVE NEGATIVE Final    Comment: (NOTE) SARS-CoV-2 target nucleic acids are NOT DETECTED.  The SARS-CoV-2 RNA is generally detectable in upper respiratory specimens during the acute phase of infection. The lowest concentration of SARS-CoV-2 viral copies this assay can detect is 138 copies/mL. A negative result does not preclude SARS-Cov-2 infection and should not be used as the sole basis for treatment or other  patient management decisions. A negative result may occur with  improper specimen collection/handling, submission of specimen other than nasopharyngeal swab, presence of viral mutation(s) within the areas targeted by this assay, and inadequate number of viral copies(<138 copies/mL). A negative result must be combined with clinical observations, patient history, and epidemiological information. The expected result is Negative.  Fact Sheet for Patients:  EntrepreneurPulse.com.au  Fact Sheet for Healthcare Providers:  IncredibleEmployment.be  This test is no t yet approved or cleared by the [place] FDA and  has been authorized for detection and/or diagnosis of SARS-CoV-2 by FDA under an Emergency Use Authorization (EUA). This EUA will remain  in effect (meaning this test can be used) for the duration of the COVID-19 declaration under Section 564(b)(1) of the Act, 21 U.S.C.section 360bbb-3(b)(1), unless the authorization is terminated  or revoked sooner.       Influenza A by PCR NEGATIVE NEGATIVE Final   Influenza B by PCR NEGATIVE NEGATIVE Final    Comment: (NOTE) The Xpert Xpress SARS-CoV-2/FLU/RSV plus assay is intended as an aid in the diagnosis of influenza from Nasopharyngeal swab specimens and should not be used as a sole basis for treatment. Nasal washings and aspirates are unacceptable for Xpert Xpress SARS-CoV-2/FLU/RSV testing.  Fact Sheet for Patients: EntrepreneurPulse.com.au  Fact Sheet for Healthcare Providers: IncredibleEmployment.be  This test is not yet approved or cleared by the [place] FDA and has been authorized for detection and/or diagnosis of SARS-CoV-2 by FDA under an Emergency Use Authorization (EUA). This EUA will remain in effect (meaning this test can be used) for the duration of the COVID-19 declaration under Section 564(b)(1) of the Act, 21 U.S.C. section  360bbb-3(b)(1), unless the authorization is terminated or revoked.     Resp Syncytial Virus by PCR NEGATIVE NEGATIVE Final    Comment: (NOTE) Fact Sheet for Patients: EntrepreneurPulse.com.au  Fact Sheet for Healthcare Providers: IncredibleEmployment.be  This test is not yet approved or cleared by the [place] FDA and has been authorized for detection and/or diagnosis of SARS-CoV-2 by FDA under an Emergency Use Authorization (EUA). This EUA will remain in effect (meaning this test can be used) for the duration of the COVID-19 declaration under Section 564(b)(1) of the Act, 21 U.S.C. section 360bbb-3(b)(1), unless the authorization is terminated or revoked.  Performed at [facility], [address] [place], [place] [address]   Urine Culture     Status: Abnormal   Collection Time: 12/02/22  3:08 AM   Specimen: Urine, Clean Catch  Result Value Ref Range Status   Specimen Description   Final    URINE, CLEAN CATCH Performed  at [facility], [address] [place], [place] [address]    Special Requests   Final    NONE Performed at [facility], [address] [place], [place] [address]    Culture MULTIPLE SPECIES PRESENT, SUGGEST RECOLLECTION (A)  Final   Report Status 12/03/2022 FINAL  Final         Radiology Studies: US Venous Img Upper Uni Left (DVT)  Result Date: 12/07/2022 CLINICAL DATA:  77 year old female with upper extremity edema. EXAM: LEFT UPPER EXTREMITY VENOUS DOPPLER ULTRASOUND TECHNIQUE: Gray-scale sonography with graded compression, as well as color Doppler and duplex ultrasound were performed to evaluate the upper extremity deep venous system from the level of the subclavian vein and including the jugular, axillary, basilic, radial, ulnar and upper cephalic vein. Spectral Doppler was utilized to evaluate flow at rest and with distal augmentation maneuvers. COMPARISON:  None Available.  FINDINGS: Contralateral Subclavian Vein: Respiratory phasicity is normal and symmetric with the symptomatic side. No evidence of thrombus. Normal compressibility. Internal Jugular Vein: No evidence of thrombus. Normal compressibility, respiratory phasicity and response to augmentation. Subclavian Vein: No evidence of thrombus. Normal compressibility, respiratory phasicity and response to augmentation. Axillary Vein: No evidence of thrombus. Normal compressibility, respiratory phasicity and response to augmentation. Cephalic Vein: No evidence of thrombus. Normal compressibility, respiratory phasicity and response to augmentation. Basilic Vein: Not visualized. Brachial Veins: No evidence of thrombus. Normal compressibility, respiratory phasicity and response to augmentation. Radial Veins: No evidence of thrombus. Normal compressibility, respiratory phasicity and response to augmentation. Ulnar Veins: Not visualized. Other Findings:  None visualized. IMPRESSION: Patent left upper evidence of deep vein thrombosis or superficial thrombophlebitis. The basilic vein and ulnar veins are not visualized. Ruthann Cancer, MD [facility] Electronically Signed   By: Ruthann Cancer M.D.   On: 12/07/2022 19:59        Scheduled Meds:  sodium chloride   Intravenous Once   enoxaparin (LOVENOX) injection  40 mg Subcutaneous M38G   folic acid  1 mg Oral Daily   multivitamin with minerals  1 tablet Oral Daily   nystatin cream   Topical BID   thiamine  100 mg Oral Daily   Or   thiamine  100 mg Intravenous Daily   Continuous Infusions:  cefTRIAXone (ROCEPHIN)  IV 1 g (12/08/22 1240)   dextrose 5 % and 0.9% NaCl 75 mL/hr at 12/08/22 1056     LOS: 6 days    Time spent: 51 minutes spent on chart review, discussion with nursing staff, consultants, updating family and interview/physical exam; more than 50% of that time was spent in counseling and/or coordination of  care.    Adrain Nesbit J [place] DO [facility] Available via Epic secure chat 7am-7pm After these hours, please refer to coverage provider listed on [email or web address] 12/08/2022, 1:02 PM

## 2022-12-08 NOTE — Progress Notes (Signed)
Pt just transferred to [facility].

## 2022-12-08 NOTE — TOC Progression Note (Addendum)
Transition of Care [facility]) - Progression Note    Patient Details  Name: Sheri Butler MRN: [id number] Date of Birth: 02-16-1946  Transition of Care [facility]) CM/SW [place], [place] Phone Number: 12/08/2022, 1:55 PM  Clinical Narrative:     Update 2: insurance auth approved until 1/3, CSW has reached out to [place] with [facility] to see if patient can admit today as medically stable, pending response.   Update 1 : all family agreeable to [facility], insurance auth started. Lavella Lemons at [facility] updated.   CSW spoke with patient's daughter Jonelle Sidle regarding 1 bed offer from [facility]. Tiffany informed that many facilities declined due to hx of alcohol abuse.   Tiffany reports she will speak with her siblings about this bed offer and let CSW know if they wish to move forward.       Expected Discharge Plan and Services                                               Social Determinants of Health (SDOH) Interventions SDOH Screenings   Food Insecurity: No Food Insecurity (12/03/2022)  Housing: Low Risk  (12/03/2022)  Transportation Needs: No Transportation Needs (12/03/2022)  Utilities: Not At Risk (12/03/2022)  Tobacco Use: Low Risk  (12/03/2022)    Readmission Risk Interventions     No data to display

## 2022-12-08 NOTE — Discharge Summary (Signed)
Physician Discharge Summary  Sheri Butler WLN:[id number] DOB: 25-Apr-1946 DOA: 12/02/2022  PCP: Denton Lank, MD  Admit date: 12/02/2022 Discharge date: 12/08/2022  Admitted From: Home Disposition:  healthcare SNF  Recommendations for Outpatient Follow-up:  Follow up with PCP in 1-2 weeks Continue encourage complete abstinence from alcohol Can continue Keflex to complete antibiotic course for cellulitis Recommend continued goals of care discussions outpatient Recommend CBC/CMP 1 week  Discharge Condition: Stable CODE STATUS: Full code Diet recommendation: Regular diet  History of present illness:  Sheri Butler is a 77 y.o. 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  Witnessed by family.  Workup in the ED was notable for hypotension, tachycardia.  Chest x-ray and CT head with no acute findings.  TRH was consulted for admission for further evaluation and management of syncope and hypotension.   Hospital course:  Acute metabolic encephalopathy Syncope Patient presenting to ED with syncopal episodes x 2 at home, confusion.  Was found to be hypotensive in the ED with associated tachycardia.  CT head and C-spine without contrast with no acute findings.  EEG with no epileptiform or seizure-like activity noted.  B12 within lower limits.  Afebrile without leukocytosis.   Hypoglycemia Continue to encourage increased oral intake.  Frequent monitoring of glucose.   Right breast cellulitis/intertrigo Continue Keflex to complete 10-day course for cellulitis.   Left upper extremity edema Etiology likely secondary to infiltrated IV site.  D-dimer elevated.  Vascular duplex ultrasound left upper spine negative for DVT.   Sick euthyroid syndrome TSH elevated 4.540, free T4 normal at 1.03. Recommend repeat TFTs  outpatient   Folic acid deficiency Folate low at 3.7.  Started on folic acid 1 mg p.o. daily   Alcohol use disorder Son reports heavy use of hard liquor on a daily basis.  No reported history of alcohol withdrawal seizures or significant withdrawal symptoms in the past.  Continue to encourage complete abstinence from alcohol.   Essential hypertension Discontinued home atenolol.   Acute on chronic anemia No evidence of bleeding clinically, transfuse 1 unit PRBC on admission.  Suspect hemodilution superimposed on myelosuppression from chronic heavy alcohol use.  Hemoglobin remained stable, 8.2 at time of discharge.  Recommend repeat CBC 1 week.   Metabolic acidosis: Resolved Serum bicarbonate 17 admission, likely secondary to dehydration.  Now resolved.   Weakness/debility/deconditioning Adult failure to thrive PT/OT evaluation: Recommend SNF placement, [facility] to [facility].  Recommend continue goals of care discussion outpatient.  If does not improve in terms of oral intake, would recommend consideration of a more comfort approach with hospice/palliative care.  Discharge Diagnoses:  Principal Problem:   Syncope Active Problems:   Cellulitis of right breast   Hypotension   Metabolic acidosis   HTN (hypertension)   Alcohol use disorder, moderate, dependence ([place])    Discharge Instructions  Discharge Instructions     Call MD for:  difficulty breathing, headache or visual disturbances   Complete by: As directed    Call MD for:  extreme fatigue   Complete by: As directed    Call MD for:  persistant dizziness or light-headedness   Complete by: As directed    Call MD for:  persistant nausea and vomiting   Complete by: As directed    Call MD for:  severe uncontrolled pain   Complete by: As directed  Call MD for:  temperature >100.4   Complete by: As directed    Diet - low sodium heart healthy   Complete by: As directed    Increase activity slowly    Complete by: As directed       Allergies as of 12/08/2022       Reactions   Sulfa Antibiotics Anaphylaxis, Swelling        Medication List     STOP taking these medications    atenolol 25 MG tablet Commonly known as: TENORMIN   guaiFENesin-dextromethorphan 100-10 MG/5ML syrup Commonly known as: ROBITUSSIN DM   pravastatin 20 MG tablet Commonly known as: PRAVACHOL       TAKE these medications    aspirin EC 81 MG tablet Take 81 mg by mouth daily.   cephALEXin 500 MG capsule Commonly known as: KEFLEX Take 1 capsule (500 mg total) by mouth 4 (four) times daily for 4 days.   folic acid 1 MG tablet Commonly known as: FOLVITE Take 1 mg by mouth daily.   ibuprofen 800 MG tablet Commonly known as: ADVIL Take 800 mg by mouth every 8 (eight) hours as needed for moderate pain.   multivitamin with minerals Tabs tablet Take 1 tablet by mouth daily. Start taking on: December 09, 2022   thiamine 100 MG tablet Commonly known as: Vitamin B-1 Take 1 tablet (100 mg total) by mouth daily. Start taking on: December 09, 2022   vitamin D3 25 MCG tablet Commonly known as: CHOLECALCIFEROL Take 1 tablet (1,000 Units total) by mouth daily.        Allergies  Allergen Reactions   Sulfa Antibiotics Anaphylaxis and Swelling    Consultations: none   Procedures/Studies: US Venous Img Upper Uni Left (DVT)  Result Date: 12/07/2022 CLINICAL DATA:  77 year old female with upper extremity edema. EXAM: LEFT UPPER EXTREMITY VENOUS DOPPLER ULTRASOUND TECHNIQUE: Gray-scale sonography with graded compression, as well as color Doppler and duplex ultrasound were performed to evaluate the upper extremity deep venous system from the level of the subclavian vein and including the jugular, axillary, basilic, radial, ulnar and upper cephalic vein. Spectral Doppler was utilized to evaluate flow at rest and with distal augmentation maneuvers. COMPARISON:  None Available. FINDINGS: Contralateral  Subclavian Vein: Respiratory phasicity is normal and symmetric with the symptomatic side. No evidence of thrombus. Normal compressibility. Internal Jugular Vein: No evidence of thrombus. Normal compressibility, respiratory phasicity and response to augmentation. Subclavian Vein: No evidence of thrombus. Normal compressibility, respiratory phasicity and response to augmentation. Axillary Vein: No evidence of thrombus. Normal compressibility, respiratory phasicity and response to augmentation. Cephalic Vein: No evidence of thrombus. Normal compressibility, respiratory phasicity and response to augmentation. Basilic Vein: Not visualized. Brachial Veins: No evidence of thrombus. Normal compressibility, respiratory phasicity and response to augmentation. Radial Veins: No evidence of thrombus. Normal compressibility, respiratory phasicity and response to augmentation. Ulnar Veins: Not visualized. Other Findings:  None visualized. IMPRESSION: Patent left upper evidence of deep vein thrombosis or superficial thrombophlebitis. The basilic vein and ulnar veins are not visualized. Ruthann Cancer, MD [facility] Electronically Signed   By: Ruthann Cancer M.D.   On: 12/07/2022 19:59   EEG adult  Result Date: 12/03/2022 Lora Havens, MD     12/03/2022  5:02 PM Patient Name: NOGA FOGG MRN: [id number] Epilepsy Attending: Lora Havens Referring Physician/Provider: Vernelle Emerald, MD Date: 12/03/2022 Duration: 24.32 mins Patient history: 77yo F with syncope. EEG to evaluate for seizure. Level of  alertness:  lethargic, asleep AEDs during EEG study: Ativan Technical aspects: This EEG study was done with scalp electrodes positioned according to the 10-20 International system of electrode placement. Electrical activity was reviewed with band pass filter of 1-'70Hz'$ , sensitivity of 7 uV/mm, display speed of 63m/sec with a '60Hz'$  notched filter applied as  appropriate. EEG data were recorded continuously and digitally stored.  Video monitoring was available and reviewed as appropriate. Description: No clear posterior dominant rhythm was seen. Sleep was characterized by sleep spindles (12-'14hz'$ ) maximal frontocentral region. EEG showed continuous generalized 3 to 6 Hz theta-delta slowing admixed with 13-'15Hz'$  generalized beta activity. Hyperventilation and photic stimulation were not performed.   ABNORMALITY - Continuous slow, generalized - Excessive beta, generalized IMPRESSION: This study is suggestive of moderate to severe diffuse encephalopathy, nonspecific etiology. The excessive beta activity seen in the background is most likely due to the effect of benzodiazepine and is a benign EEG pattern. No seizures or epileptiform discharges were seen throughout the recording. Priyanka OBarbra Sarks  CT HEAD WO CONTRAST (5MM)  Result Date: 12/02/2022 CLINICAL DATA:  Trauma. EXAM: CT HEAD WITHOUT CONTRAST CT CERVICAL SPINE WITHOUT CONTRAST TECHNIQUE: Multidetector CT imaging of the head and cervical spine was performed following the standard protocol without intravenous contrast. Multiplanar CT image reconstructions of the cervical spine were also generated. RADIATION DOSE REDUCTION: This exam was performed according to the departmental dose-optimization program which includes automated exposure control, adjustment of the mA and/or kV according to patient size and/or use of iterative reconstruction technique. COMPARISON:  Brain MRI dated 11/20/2015. FINDINGS: Evaluation of this exam is limited due to motion artifact. CT HEAD FINDINGS Brain: Mild age-related atrophy and chronic microvascular ischemic changes. There is no acute intracranial hemorrhage. No mass effect or midline shift no extra-axial fluid collection. Vascular: No hyperdense vessel or unexpected calcification. Skull: Normal. Negative for fracture or focal lesion. Sinuses/Orbits: The visualized paranasal sinuses  and mastoid air cells are clear. Age indeterminate, likely chronic depressed fracture of the right lamina Propecia. Other: None CT CERVICAL SPINE FINDINGS Alignment: No acute subluxation. There is reversal of normal cervical lordosis which may be positional or due to muscle spasm. Skull base and vertebrae: No acute fracture. Soft tissues and spinal canal: No prevertebral fluid or swelling. No visible canal hematoma. Disc levels: No acute findings. Multilevel degenerative changes with facet arthropathy. Upper chest: Negative. Other: Left carotid bulb calcified plaques. IMPRESSION: 1. No acute intracranial pathology. Mild age-related atrophy and chronic microvascular ischemic changes. 2. No acute/traumatic cervical spine pathology. Electronically Signed   By: AAnner CreteM.D.   On: 12/02/2022 02:51   CT Cervical Spine Wo Contrast  Result Date: 12/02/2022 CLINICAL DATA:  Trauma. EXAM: CT HEAD WITHOUT CONTRAST CT CERVICAL SPINE WITHOUT CONTRAST TECHNIQUE: Multidetector CT imaging of the head and cervical spine was performed following the standard protocol without intravenous contrast. Multiplanar CT image reconstructions of the cervical spine were also generated. RADIATION DOSE REDUCTION: This exam was performed according to the departmental dose-optimization program which includes automated exposure control, adjustment of the mA and/or kV according to patient size and/or use of iterative reconstruction technique. COMPARISON:  Brain MRI dated 11/20/2015. FINDINGS: Evaluation of this exam is limited due to motion artifact. CT HEAD FINDINGS Brain: Mild age-related atrophy and chronic microvascular ischemic changes. There is no acute intracranial hemorrhage. No mass effect or midline shift no extra-axial fluid collection. Vascular: No hyperdense vessel or unexpected calcification. Skull: Normal. Negative for fracture or focal lesion. Sinuses/Orbits: The visualized paranasal  sinuses and mastoid air cells are  clear. Age indeterminate, likely chronic depressed fracture of the right lamina Propecia. Other: None CT CERVICAL SPINE FINDINGS Alignment: No acute subluxation. There is reversal of normal cervical lordosis which may be positional or due to muscle spasm. Skull base and vertebrae: No acute fracture. Soft tissues and spinal canal: No prevertebral fluid or swelling. No visible canal hematoma. Disc levels: No acute findings. Multilevel degenerative changes with facet arthropathy. Upper chest: Negative. Other: Left carotid bulb calcified plaques. IMPRESSION: 1. No acute intracranial pathology. Mild age-related atrophy and chronic microvascular ischemic changes. 2. No acute/traumatic cervical spine pathology. Electronically Signed   By: Anner Crete M.D.   On: 12/02/2022 02:51   DG Chest Portable 1 View  Result Date: 12/02/2022 CLINICAL DATA:  Syncope. EXAM: PORTABLE CHEST 1 VIEW COMPARISON:  Chest radiograph dated 12/21/2020. FINDINGS: Shallow inspiration. There is eventration of the right hemidiaphragm. No focal consolidation, pleural effusion, or pneumothorax. Mild cardiomegaly. No acute osseous pathology. IMPRESSION: 1. No active disease. 2. Mild cardiomegaly. Electronically Signed   By: Anner Crete M.D.   On: 12/02/2022 02:09     Subjective: Patient seen examined bedside, resting comfortably.  No specific complaints this morning.  Discharging to SNF today.  Denies headache, no chest pain, no shortness of breath, no abdominal pain.    Discharge Exam: Vitals:   12/08/22 0900 12/08/22 1258  BP: 114/61 117/62  Pulse: 96 92  Resp:  17  Temp:  98.3 F (36.8 C)  SpO2:  98%   Vitals:   12/08/22 0407 12/08/22 0700 12/08/22 0900 12/08/22 1258  BP: 106/60 (!) 185/161 114/61 117/62  Pulse: (!) 102 (!) 101 96 92  Resp: '17 17  17  '$ Temp: 99.1 F (37.3 C) 98 F (36.7 C)  98.3 F (36.8 C)  TempSrc: Axillary     SpO2: 94% 100%  98%  Weight:      Height:        Physical Exam: GEN: NAD,  alert, chronically ill appearance, appears older than stated age, poorly interactive HEENT: NCAT, PERRL, EOMI, sclera clear, dry mucous membranes PULM: CTAB w/o wheezes/crackles, normal respiratory effort, on room air CV: RRR w/o M/G/R GI: abd soft, NTND, NABS, no R/G/M MSK: + LUE edema, moves all extremities independently NEURO: No focal neurological deficits noted PSYCH: Depressed mood, flat affect Integumentary: Redness noted bilateral breast folds, otherwise no other concerning rashes/lesions/wounds noted on exposed skin surfaces.      The results of significant diagnostics from this hospitalization (including imaging, microbiology, ancillary and laboratory) are listed below for reference.     Microbiology: Recent Results (from the past 240 hour(s))  Resp panel by RT-PCR (RSV, Flu A&B, Covid) Anterior Nasal Swab     Status: None   Collection Time: 12/02/22  1:21 AM   Specimen: Anterior Nasal Swab  Result Value Ref Range Status   SARS Coronavirus 2 by RT PCR NEGATIVE NEGATIVE Final    Comment: (NOTE) SARS-CoV-2 target nucleic acids are NOT DETECTED.  The SARS-CoV-2 RNA is generally detectable in upper respiratory specimens during the acute phase of infection. The lowest concentration of SARS-CoV-2 viral copies this assay can detect is 138 copies/mL. A negative result does not preclude SARS-Cov-2 infection and should not be used as the sole basis for treatment or other patient management decisions. A negative result may occur with  improper specimen collection/handling, submission of specimen other than nasopharyngeal swab, presence of viral mutation(s) within the areas targeted by this assay, and inadequate  number of viral copies(<138 copies/mL). A negative result must be combined with clinical observations, patient history, and epidemiological information. The expected result is Negative.  Fact Sheet for Patients:  EntrepreneurPulse.com.au  Fact Sheet for  Healthcare Providers:  IncredibleEmployment.be  This test is no t yet approved or cleared by the [place] FDA and  has been authorized for detection and/or diagnosis of SARS-CoV-2 by FDA under an Emergency Use Authorization (EUA). This EUA will remain  in effect (meaning this test can be used) for the duration of the COVID-19 declaration under Section 564(b)(1) of the Act, 21 U.S.C.section 360bbb-3(b)(1), unless the authorization is terminated  or revoked sooner.       Influenza A by PCR NEGATIVE NEGATIVE Final   Influenza B by PCR NEGATIVE NEGATIVE Final    Comment: (NOTE) The Xpert Xpress SARS-CoV-2/FLU/RSV plus assay is intended as an aid in the diagnosis of influenza from Nasopharyngeal swab specimens and should not be used as a sole basis for treatment. Nasal washings and aspirates are unacceptable for Xpert Xpress SARS-CoV-2/FLU/RSV testing.  Fact Sheet for Patients: EntrepreneurPulse.com.au  Fact Sheet for Healthcare Providers: IncredibleEmployment.be  This test is not yet approved or cleared by the [place] FDA and has been authorized for detection and/or diagnosis of SARS-CoV-2 by FDA under an Emergency Use Authorization (EUA). This EUA will remain in effect (meaning this test can be used) for the duration of the COVID-19 declaration under Section 564(b)(1) of the Act, 21 U.S.C. section 360bbb-3(b)(1), unless the authorization is terminated or revoked.     Resp Syncytial Virus by PCR NEGATIVE NEGATIVE Final    Comment: (NOTE) Fact Sheet for Patients: EntrepreneurPulse.com.au  Fact Sheet for Healthcare Providers: IncredibleEmployment.be  This test is not yet approved or cleared by the [place] FDA and has been authorized for detection and/or diagnosis of SARS-CoV-2 by FDA under an Emergency Use Authorization (EUA). This EUA will remain in effect (meaning this  test can be used) for the duration of the COVID-19 declaration under Section 564(b)(1) of the Act, 21 U.S.C. section 360bbb-3(b)(1), unless the authorization is terminated or revoked.  Performed at [facility], [address], [place], [place] [address]   Urine Culture     Status: Abnormal   Collection Time: 12/02/22  3:08 AM   Specimen: Urine, Clean Catch  Result Value Ref Range Status   Specimen Description   Final    URINE, CLEAN CATCH Performed at [facility], [address] [place], [place] [address]    Special Requests   Final    NONE Performed at [facility], [address] [place], [place] [address]    Culture MULTIPLE SPECIES PRESENT, SUGGEST RECOLLECTION (A)  Final   Report Status 12/03/2022 FINAL  Final     Labs: BNP (last 3 results) No results for input(s): "BNP" in the last 8760 hours. Basic Metabolic Panel: Recent Labs  Lab 12/01/22 1509 12/02/22 0530 12/03/22 0020 12/04/22 0430  NA 134* 139 137 137  K 3.9 3.5 3.1* 3.8  CL 95* 105 105 108  CO2 17* '22 23 22  '$ GLUCOSE 98 151* 89 81  BUN '18 18 13 8  '$ CREATININE 1.10* 1.07* 0.95 0.69  CALCIUM 9.3 7.9* 8.0* 8.3*  MG  --   --  1.3* 1.7   Liver Function Tests: Recent Labs  Lab 12/02/22 0121 12/03/22 0020 12/04/22 0430  AST '27 25 24  '$ ALT '9 9 9  '$ ALKPHOS 38 38 44  BILITOT 1.7* 2.9* 1.0  PROT 6.6 6.0* 5.7*  ALBUMIN 2.7* 2.5* 2.4*   No results for input(s): "LIPASE", "AMYLASE" in the last 168 hours. Recent Labs  Lab 12/03/22 1612  AMMONIA <10   CBC: Recent Labs  Lab 12/02/22 0530 12/02/22 1030 12/03/22 0020 12/03/22 0853 12/04/22 0430 12/05/22 0437  WBC 5.5  --  4.7 5.0 5.9 6.2  NEUTROABS  --   --  3.5 3.7 4.5  --   HGB 7.1* 7.0* 8.3* 9.3* 8.4* 8.2*  HCT 22.8* 22.5* 26.2* 29.3* 25.7* 25.8*  MCV 89.8  --  88.8 87.5 85.4 86.6  PLT 148*  --  122* 140* 163 153   Cardiac Enzymes: No results for input(s): "CKTOTAL", "CKMB", "CKMBINDEX", "TROPONINI" in the  last 168 hours. BNP: Invalid input(s): "POCBNP" CBG: Recent Labs  Lab 12/07/22 2119 12/07/22 2143 12/07/22 2236 12/08/22 0017 12/08/22 0403  GLUCAP 57* 69* 88 117* 102*   D-Dimer Recent Labs    12/06/22 1522  DDIMER 2.93*   Hgb A1c No results for input(s): "HGBA1C" in the last 72 hours. Lipid Profile No results for input(s): "CHOL", "HDL", "LDLCALC", "TRIG", "CHOLHDL", "LDLDIRECT" in the last 72 hours. Thyroid function studies No results for input(s): "TSH", "T4TOTAL", "T3FREE", "THYROIDAB" in the last 72 hours.  Invalid input(s): "FREET3" Anemia work up No results for input(s): "VITAMINB12", "FOLATE", "FERRITIN", "TIBC", "IRON", "RETICCTPCT" in the last 72 hours. Urinalysis    Component Value Date/Time   COLORURINE AMBER (A) 12/01/2022 1443   APPEARANCEUR HAZY (A) 12/01/2022 1443   LABSPEC 1.023 12/01/2022 1443   PHURINE 5.0 12/01/2022 1443   GLUCOSEU NEGATIVE 12/01/2022 1443   HGBUR NEGATIVE 12/01/2022 1443   BILIRUBINUR NEGATIVE 12/01/2022 1443   KETONESUR 20 (A) 12/01/2022 1443   PROTEINUR 30 (A) 12/01/2022 1443   NITRITE NEGATIVE 12/01/2022 1443   LEUKOCYTESUR LARGE (A) 12/01/2022 1443   Sepsis Labs Recent Labs  Lab 12/03/22 0020 12/03/22 0853 12/04/22 0430 12/05/22 0437  WBC 4.7 5.0 5.9 6.2   Microbiology Recent Results (from the past 240 hour(s))  Resp panel by RT-PCR (RSV, Flu A&B, Covid) Anterior Nasal Swab     Status: None   Collection Time: 12/02/22  1:21 AM   Specimen: Anterior Nasal Swab  Result Value Ref Range Status   SARS Coronavirus 2 by RT PCR NEGATIVE NEGATIVE Final    Comment: (NOTE) SARS-CoV-2 target nucleic acids are NOT DETECTED.  The SARS-CoV-2 RNA is generally detectable in upper respiratory specimens during the acute phase of infection. The lowest concentration of SARS-CoV-2 viral copies this assay can detect is 138 copies/mL. A negative result does not preclude SARS-Cov-2 infection and should not be used as the sole basis  for treatment or other patient management decisions. A negative result may occur with  improper specimen collection/handling, submission of specimen other than nasopharyngeal swab, presence of viral mutation(s) within the areas targeted by this assay, and inadequate number of viral copies(<138 copies/mL). A negative result must be combined with clinical observations, patient history, and epidemiological information. The expected result is Negative.  Fact Sheet for Patients:  EntrepreneurPulse.com.au  Fact Sheet for Healthcare Providers:  IncredibleEmployment.be  This test is no t yet approved or cleared by the [place] FDA and  has been authorized for detection and/or diagnosis of SARS-CoV-2 by FDA under an Emergency Use Authorization (EUA). This EUA will remain  in effect (meaning this test can be used) for the duration of the COVID-19 declaration under Section 564(b)(1) of the Act, 21 U.S.C.section 360bbb-3(b)(1), unless the authorization is terminated  or revoked sooner.       Influenza A by PCR NEGATIVE NEGATIVE Final   Influenza B by PCR NEGATIVE NEGATIVE Final    Comment: (NOTE) The Xpert Xpress SARS-CoV-2/FLU/RSV plus assay is intended as an aid in the diagnosis of influenza from Nasopharyngeal swab specimens and should not be used as a sole basis for treatment. Nasal washings and aspirates are unacceptable for Xpert Xpress SARS-CoV-2/FLU/RSV testing.  Fact Sheet for Patients: EntrepreneurPulse.com.au  Fact Sheet for Healthcare Providers: IncredibleEmployment.be  This test is not yet approved or cleared by the [place] FDA and has been authorized for detection and/or diagnosis of SARS-CoV-2 by FDA under an Emergency Use Authorization (EUA). This EUA will remain in effect (meaning this test can be used) for the duration of the COVID-19 declaration under Section 564(b)(1) of the Act, 21  U.S.C. section 360bbb-3(b)(1), unless the authorization is terminated or revoked.     Resp Syncytial Virus by PCR NEGATIVE NEGATIVE Final    Comment: (NOTE) Fact Sheet for Patients: EntrepreneurPulse.com.au  Fact Sheet for Healthcare Providers: IncredibleEmployment.be  This test is not yet approved or cleared by the [place] FDA and has been authorized for detection and/or diagnosis of SARS-CoV-2 by FDA under an Emergency Use Authorization (EUA). This EUA will remain in effect (meaning this test can be used) for the duration of the COVID-19 declaration under Section 564(b)(1) of the Act, 21 U.S.C. section 360bbb-3(b)(1), unless the authorization is terminated or revoked.  Performed at [facility], [address] [place], [place] [address]   Urine Culture     Status: Abnormal   Collection Time: 12/02/22  3:08 AM   Specimen: Urine, Clean Catch  Result Value Ref Range Status   Specimen Description   Final    URINE, CLEAN CATCH Performed at [facility], [address] [place], [place] [address]    Special Requests   Final    NONE Performed at [facility], [address] [place], [place] [address]    Culture MULTIPLE SPECIES PRESENT, SUGGEST RECOLLECTION (A)  Final   Report Status 12/03/2022 FINAL  Final     Time coordinating discharge: Over 30 minutes  SIGNED:   Alson Mcpheeters J [place] DO  [facility] 12/08/2022, 2:41 PM

## 2024-12-28 ENCOUNTER — Emergency Department
Admission: EM | Admit: 2024-12-28 | Discharge: 2024-12-29 | Disposition: A | Discharge status: Skilled Nursing Facility | Attending: Emergency Medicine | Admitting: Emergency Medicine

## 2024-12-28 DIAGNOSIS — L299 Pruritus, unspecified: Secondary | ICD-10-CM | POA: Insufficient documentation

## 2024-12-28 DIAGNOSIS — I10 Essential (primary) hypertension: Secondary | ICD-10-CM | POA: Insufficient documentation

## 2024-12-28 MED ORDER — CETIRIZINE HCL 5 MG/5ML PO SOLN
10.0000 mg | Freq: Once | ORAL | Status: AC
  Administered 2024-12-29: 10 mg via ORAL
  Filled 2024-12-28 (×2): qty 10

## 2024-12-28 MED ORDER — WHITE PETROLATUM EX OINT
1.0000 | TOPICAL_OINTMENT | CUTANEOUS | Status: DC | PRN
  Filled 2024-12-28: qty 5

## 2024-12-28 NOTE — ED Triage Notes (Signed)
 Pt to ED BIB [facility] with c/o itchy feet. Pt reports symptoms started tonight, denies hx of similar feeling. Received tylenol  prior from facility staff prior to arrival.

## 2024-12-28 NOTE — ED Provider Notes (Signed)
 [facility] Provider Note    Event Date/Time   First MD Initiated Contact with Patient 12/28/24 2348     (approximate)   History   Pruritis   HPI  Sheri Butler is a 79 y.o. female with history of hyperlipidemia and hypertension presenting with pruritus to her bilateral feet.  No bug bites, no recent falls or trauma, no wounds.  Feet are nontender.  No rash anywhere else, no shortness of breath or other systemic symptoms.  No new medication changes or exposures.  Per independent history from EMS, her facility gave her some Tylenol  and called EMS to take her to the emergency department for evaluation.     Physical Exam   Triage Vital Signs: ED Triage Vitals  Encounter Vitals Group     BP      Girls Systolic BP Percentile      Girls Diastolic BP Percentile      Boys Systolic BP Percentile      Boys Diastolic BP Percentile      Pulse      Resp      Temp      Temp src      SpO2      Weight      Height      Head Circumference      Peak Flow      Pain Score      Pain Loc      Pain Education      Exclude from Growth Chart     Most recent vital signs: Vitals:   12/28/24 2352  BP: (!) 162/65  Pulse: 62  Resp: 16  Temp: 97.9 F (36.6 C)  SpO2: 97%     General: Awake, no distress.  CV:  Good peripheral perfusion.  Resp:  Normal effort.  Abd:  No distention.  Other:  Bilateral feet are warm, palpable DP pulses bilaterally, nontender to palpation, there is no obvious rash, her bilateral soles of her feet are quite dry.   ED Results / Procedures / Treatments   Labs (all labs ordered are listed, but only abnormal results are displayed) Labs Reviewed  COMPREHENSIVE METABOLIC PANEL WITH GFR - Abnormal; Notable for the following components:      Result Value   Glucose, Bld 110 (*)    AST 13 (*)    All other components within normal limits  CBC WITH DIFFERENTIAL/PLATELET      PROCEDURES:  Critical Care performed:  No  Procedures   MEDICATIONS ORDERED IN ED: Medications  white petrolatum  (VASELINE) gel 1 Application (has no administration in time range)  cetirizine  HCl (Zyrtec ) 5 MG/5ML solution 10 mg (10 mg Oral Given 12/29/24 0149)     IMPRESSION / MDM / ASSESSMENT AND PLAN / ED COURSE  I reviewed the triage vital signs and the nursing notes.                              Differential diagnosis includes, but is not limited to, pruritus, did consider possible allergies but she has no new exposures, could be due to dry skin, will get basic labs just to make sure that this is not secondary to hyperbilirubinemia.  Will give her some Zyrtec  here.  Anticipate discharge back to facility.  Patient's presentation is most consistent with acute presentation with potential threat to life or bodily function.  Independent interpretation of labs below.  Labs are reassuring.  Electrolytes really deranged, bilirubin is not elevated.  No leukocytosis.  Patient stable for outpatient management.  Will discharge back to facility.  Considered but no indication for inpatient admission at this time.  Will have her follow-up with primary care for reassessment.  Strict return precautions given.  Discharge.        FINAL CLINICAL IMPRESSION(S) / ED DIAGNOSES   Final diagnoses:  Pruritus     Rx / DC Orders   ED Discharge Orders     None        Note:  This document was prepared using Dragon voice recognition software and may include unintentional dictation errors.    Waymond Lorelle Cummins, MD 12/29/24 [phone]

## 2024-12-29 LAB — CBC WITH DIFFERENTIAL/PLATELET
Abs Immature Granulocytes: 0.01 K/uL (ref 0.00–0.07)
Basophils Absolute: 0.1 K/uL (ref 0.0–0.1)
Basophils Relative: 1 %
Eosinophils Absolute: 0.1 K/uL (ref 0.0–0.5)
Eosinophils Relative: 3 %
HCT: 44.9 % (ref 36.0–46.0)
Hemoglobin: 13.6 g/dL (ref 12.0–15.0)
Immature Granulocytes: 0 %
Lymphocytes Relative: 30 %
Lymphs Abs: 1.5 K/uL (ref 0.7–4.0)
MCH: 29.1 pg (ref 26.0–34.0)
MCHC: 30.3 g/dL (ref 30.0–36.0)
MCV: 96.1 fL (ref 80.0–100.0)
Monocytes Absolute: 0.3 K/uL (ref 0.1–1.0)
Monocytes Relative: 6 %
Neutro Abs: 3 K/uL (ref 1.7–7.7)
Neutrophils Relative %: 60 %
Platelets: 206 K/uL (ref 150–400)
RBC: 4.67 MIL/uL (ref 3.87–5.11)
RDW: 14 % (ref 11.5–15.5)
Smear Review: NORMAL
WBC: 5.1 K/uL (ref 4.0–10.5)
nRBC: 0 % (ref 0.0–0.2)

## 2024-12-29 LAB — COMPREHENSIVE METABOLIC PANEL WITH GFR
ALT: <5 U/L (ref 0–44)
AST: 13 U/L — ABNORMAL LOW (ref 15–41)
Albumin: 3.5 g/dL (ref 3.5–5.0)
Alkaline Phosphatase: 61 U/L (ref 38–126)
Anion gap: 9 (ref 5–15)
BUN: 15 mg/dL (ref 8–23)
CO2: 25 mmol/L (ref 22–32)
Calcium: 9.2 mg/dL (ref 8.9–10.3)
Chloride: 108 mmol/L (ref 98–111)
Creatinine, Ser: 0.8 mg/dL (ref 0.44–1.00)
GFR, Estimated: >60 mL/min
Glucose, Bld: 110 mg/dL — ABNORMAL HIGH (ref 70–99)
Potassium: 4 mmol/L (ref 3.5–5.1)
Sodium: 143 mmol/L (ref 135–145)
Total Bilirubin: 0.4 mg/dL (ref 0.0–1.2)
Total Protein: 6.7 g/dL (ref 6.5–8.1)

## 2024-12-29 NOTE — ED Notes (Signed)
 Pt 1st on list for pick up it will be a couple hours per rep
